# Patient Record
Sex: Female | Born: 1985 | Race: Black or African American | Hispanic: No | Marital: Married | Smoking: Former smoker
Health system: Southern US, Community
[De-identification: ages and names within clinical notes are randomized; demographics above are authoritative.]

## PROBLEM LIST (undated history)

## (undated) DIAGNOSIS — O1490 Unspecified pre-eclampsia, unspecified trimester: Secondary | ICD-10-CM

## (undated) DIAGNOSIS — F32A Depression, unspecified: Secondary | ICD-10-CM

## (undated) DIAGNOSIS — F419 Anxiety disorder, unspecified: Secondary | ICD-10-CM

## (undated) DIAGNOSIS — G43909 Migraine, unspecified, not intractable, without status migrainosus: Secondary | ICD-10-CM

## (undated) DIAGNOSIS — I890 Lymphedema, not elsewhere classified: Secondary | ICD-10-CM

## (undated) DIAGNOSIS — I10 Essential (primary) hypertension: Secondary | ICD-10-CM

## (undated) HISTORY — DX: Depression, unspecified: F32.A

## (undated) HISTORY — PX: LYMPHADENECTOMY: SHX15

## (undated) HISTORY — DX: Migraine, unspecified, not intractable, without status migrainosus: G43.909

## (undated) HISTORY — DX: Anxiety disorder, unspecified: F41.9

---
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
## 2012-05-13 ENCOUNTER — Emergency Department (HOSPITAL_COMMUNITY): Payer: Self-pay

## 2012-05-13 ENCOUNTER — Encounter (HOSPITAL_COMMUNITY): Payer: Self-pay | Admitting: Emergency Medicine

## 2012-05-13 ENCOUNTER — Emergency Department (HOSPITAL_COMMUNITY)
Admission: EM | Admit: 2012-05-13 | Discharge: 2012-05-13 | Disposition: A | Discharge status: Home / Self Care | Payer: Self-pay | Attending: Emergency Medicine | Admitting: Emergency Medicine

## 2012-05-13 DIAGNOSIS — S93609A Unspecified sprain of unspecified foot, initial encounter: Secondary | ICD-10-CM | POA: Insufficient documentation

## 2012-05-13 DIAGNOSIS — F17200 Nicotine dependence, unspecified, uncomplicated: Secondary | ICD-10-CM | POA: Insufficient documentation

## 2012-05-13 DIAGNOSIS — S96911A Strain of unspecified muscle and tendon at ankle and foot level, right foot, initial encounter: Secondary | ICD-10-CM

## 2012-05-13 DIAGNOSIS — Z9889 Other specified postprocedural states: Secondary | ICD-10-CM | POA: Insufficient documentation

## 2012-05-13 DIAGNOSIS — X500XXA Overexertion from strenuous movement or load, initial encounter: Secondary | ICD-10-CM | POA: Insufficient documentation

## 2012-05-13 DIAGNOSIS — M7989 Other specified soft tissue disorders: Secondary | ICD-10-CM | POA: Insufficient documentation

## 2012-05-13 DIAGNOSIS — Y92009 Unspecified place in unspecified non-institutional (private) residence as the place of occurrence of the external cause: Secondary | ICD-10-CM | POA: Insufficient documentation

## 2012-05-13 DIAGNOSIS — R1909 Other intra-abdominal and pelvic swelling, mass and lump: Secondary | ICD-10-CM | POA: Insufficient documentation

## 2012-05-13 DIAGNOSIS — Y9389 Activity, other specified: Secondary | ICD-10-CM | POA: Insufficient documentation

## 2012-05-13 MED ORDER — IBUPROFEN 600 MG PO TABS: 600.0000 mg | ORAL_TABLET | Freq: Four times a day (QID) | ORAL | Status: DC | PRN

## 2012-05-13 NOTE — ED Notes (Addendum)
Went in to d/c pt. Pt was not found x2. Attempted to call pt x2. Number not in service.

## 2012-05-13 NOTE — ED Notes (Signed)
Pt c/o "cyst" to right hip that has larger and more painful. Pt also c/o pain/swelling to right foot x 2 days.

## 2012-05-16 NOTE — ED Provider Notes (Signed)
Medical screening examination/treatment/procedure(s) were performed by non-physician practitioner and as supervising physician I was immediately available for consultation/collaboration.   Jenai Scaletta M Deletha Jaffee, DO 05/16/12 1309 

## 2012-05-16 NOTE — ED Provider Notes (Signed)
History     CSN: [id number]  Arrival date & time 05/13/12  4132   First MD Initiated Contact with Patient 05/13/12 1030      Chief Complaint  Patient presents with  . Foot Pain  . Cyst    (Consider location/radiation/quality/duration/timing/severity/associated sxs/prior treatment) HPI Comments: Emma Scott presents with right foot pain which started suddenly when stepping out of her shower 2 days ago.  She denies injury such as inverting her foot,  Or stepping on an uneven surface.  Her foot has been swollen along the lateral edge since the event and she is concerned about possible fracture.  She has used no medication or treatments   Pain is aching,  And worse when weight bearing and palpation.   She also has concern about a swelling of her right groin which has been present for the past year, and has slowly become more swollen and tender. There has been no drainage from this lesion, nor has there been any redness.  She has not seen her pcp for this complaint nor has she tried any treatments.  The history is provided by the patient.    History reviewed. No pertinent past medical history.  Past Surgical History  Procedure Date  . Cesarean section   . Lymphadenectomy     No family history on file.  History  Substance Use Topics  . Smoking status: Current Every Day Smoker -- 0.2 packs/day  . Smokeless tobacco: Not on file  . Alcohol Use: Yes     Comment: occasional    OB History    Grav Para Term Preterm Abortions TAB SAB Ect Mult Living                  Review of Systems  Musculoskeletal: Positive for joint swelling and arthralgias.  Skin: Negative for rash and wound.  Neurological: Negative for weakness and numbness.    Allergies  Penicillins and Sulfa antibiotics  Home Medications   Current Outpatient Rx  Name  Route  Sig  Dispense  Refill  . IBUPROFEN 600 MG PO TABS   Oral   Take 1 tablet (600 mg total) by mouth every 6 (six) hours as needed for  pain.   30 tablet   0     BP 125/84  Pulse 68  Temp 98.3 F (36.8 C)  Resp 16  Ht 5\' 3"  (1.6 m)  Wt 235 lb (106.595 kg)  BMI 41.63 kg/m2  SpO2 100%  LMP 04/22/2012  Physical Exam  Constitutional: She appears well-developed and well-nourished.  HENT:  Head: Atraumatic.  Neck: Normal range of motion.  Cardiovascular:       Pulses equal bilaterally  Musculoskeletal: She exhibits tenderness.       ttp at right 5th metatarsal head without edema, erythema or deformity.  Neurological: She is alert. She has normal strength. She displays normal reflexes. No sensory deficit.       Equal strength  Skin: Skin is warm and dry.       Non draining , slightly indurated nodule right groin crease.  No drainage, erythema or pointing.    Psychiatric: She has a normal mood and affect.    ED Course  Procedures (including critical care time)  Labs Reviewed - No data to display No results found.   1. Right foot strain   2. Nodule of groin    Informal bedside US of right groin reveals completely compressible superficial circumferential and elongated structure, ? Vascular structure, doubt sebaceous  cyst,  Lymph node.    MDM  Patients labs and/or radiological studies were reviewed during the medical decision making and disposition process. Pt prescribed ibuprofen,  Encouraged rest of foot,  Elevation.  Recommended crutches, aso,  Minimize weight bearing.  F/u with pcp if not improved over the next week.  Warm compresses applied to groin.          Burgess Amor, PA 05/16/12 1300

## 2012-05-22 ENCOUNTER — Encounter (HOSPITAL_COMMUNITY): Payer: Self-pay

## 2012-05-22 ENCOUNTER — Emergency Department (HOSPITAL_COMMUNITY)
Admission: EM | Admit: 2012-05-22 | Discharge: 2012-05-22 | Disposition: A | Discharge status: Home / Self Care | Payer: Self-pay | Attending: Emergency Medicine | Admitting: Emergency Medicine

## 2012-05-22 DIAGNOSIS — L02214 Cutaneous abscess of groin: Secondary | ICD-10-CM

## 2012-05-22 DIAGNOSIS — Z4801 Encounter for change or removal of surgical wound dressing: Secondary | ICD-10-CM | POA: Insufficient documentation

## 2012-05-22 DIAGNOSIS — F17200 Nicotine dependence, unspecified, uncomplicated: Secondary | ICD-10-CM | POA: Insufficient documentation

## 2012-05-22 MED ORDER — OXYCODONE-ACETAMINOPHEN 5-325 MG PO TABS
1.0000 | ORAL_TABLET | Freq: Once | ORAL | Status: AC
  Administered 2012-05-22: 1 via ORAL
  Filled 2012-05-22: qty 1

## 2012-05-22 MED ORDER — DOXYCYCLINE HYCLATE 100 MG PO TABS
100.0000 mg | ORAL_TABLET | Freq: Once | ORAL | Status: AC
  Administered 2012-05-22: 100 mg via ORAL
  Filled 2012-05-22: qty 1

## 2012-05-22 MED ORDER — DOXYCYCLINE HYCLATE 100 MG PO CAPS: 100.0000 mg | ORAL_CAPSULE | Freq: Two times a day (BID) | ORAL | Status: DC

## 2012-05-22 MED ORDER — HYDROCODONE-ACETAMINOPHEN 5-325 MG PO TABS: 1.0000 | ORAL_TABLET | ORAL | Status: DC | PRN

## 2012-05-22 NOTE — ED Notes (Signed)
Pt stated she has been having nausea and vomiting.   Drinking coffee at arrival. Denies any fevers.

## 2012-05-22 NOTE — ED Notes (Signed)
Patient with no complaints at this time. Respirations even and unlabored. Skin warm/dry. Discharge instructions reviewed with patient at this time. Patient given opportunity to voice concerns/ask questions. Patient discharged at this time and left Emergency Department with steady gait.   

## 2012-05-22 NOTE — ED Notes (Signed)
Patient ambulatory to room with steady gait for triage.

## 2012-05-22 NOTE — ED Provider Notes (Signed)
History     CSN: [id number]  Arrival date & time 05/22/12  4098   None     Chief Complaint  Patient presents with  . Wound Check    HPI Emma Scott is a 26 y.o. female who present to the ED for tender swollen area. The area is located in the right inguinal area. The pain started about a week ago. She rates the pain as 8/10. She noted drainage from the area yesterday but today the area is hard and painful. She denies fever, nausea, vomiting or any other problems. The history was provided by the patient.  History reviewed. No pertinent past medical history.  Past Surgical History  Procedure Date  . Cesarean section   . Lymphadenectomy     No family history on file.  History  Substance Use Topics  . Smoking status: Current Every Day Smoker -- 0.2 packs/day    Types: Cigarettes  . Smokeless tobacco: Not on file  . Alcohol Use: Yes     Comment: occasional    OB History    Grav Para Term Preterm Abortions TAB SAB Ect Mult Living                  Review of Systems  Constitutional: Negative for fever and activity change.  HENT: Negative.   Eyes: Negative.   Respiratory: Negative.   Gastrointestinal: Negative for nausea, abdominal pain and diarrhea.       Pain right inguinal area  Genitourinary: Negative for dysuria, urgency and frequency.  Musculoskeletal: Negative for back pain.  Psychiatric/Behavioral: Negative for confusion. The patient is not nervous/anxious.     Allergies  Penicillins and Sulfa antibiotics  Home Medications  No current outpatient prescriptions on file.  BP 129/77  Pulse 93  Temp 98.1 F (36.7 C) (Oral)  Resp 20  SpO2 99%  LMP 05/16/2012  Physical Exam  Nursing note and vitals reviewed. Constitutional: No distress.       Obese A/A female  HENT:  Head: Normocephalic.  Neck: Normal range of motion. Neck supple.  Cardiovascular: Normal rate.   Pulmonary/Chest: Effort normal.  Abdominal: Soft.       Red, firm, raised, tender  area palpated right inguinal area. Small amount of drainage noted.   Psychiatric: She has a normal mood and affect. Her behavior is normal. Judgment and thought content normal.   Assessment: 26 y.o. female with abscess to right groin  Plan;  Rx doxycycline   Rx Norco   Warm wet compresses to the area   . Recheck 2 days   Discussed with the patient and all questioned fully answered. She will return in 2 days or sooner if any problems arise.    Medication List     As of 05/22/2012 11:05 AM    START taking these medications         doxycycline 100 MG capsule   Commonly known as: VIBRAMYCIN   Take 1 capsule (100 mg total) by mouth 2 (two) times daily.      HYDROcodone-acetaminophen 5-325 MG per tablet   Commonly known as: NORCO/VICODIN   Take 1 tablet by mouth every 4 (four) hours as needed for pain.          Where to get your medications    These are the prescriptions that you need to pick up.   You may get these medications from any pharmacy.         doxycycline 100 MG capsule   HYDROcodone-acetaminophen 5-325  MG per tablet           ED Course  Procedures      [facility], NP 05/22/12 1105

## 2012-05-22 NOTE — ED Notes (Signed)
Pt wants area to right upper leg rechecked was seen for same on the 12/11.  Left without discharge papers at that visit.

## 2012-05-22 NOTE — ED Provider Notes (Signed)
Medical screening examination/treatment/procedure(s) were performed by non-physician practitioner and as supervising physician I was immediately available for consultation/collaboration.   Joya Gaskins, MD 05/22/12 [phone]

## 2013-01-15 ENCOUNTER — Emergency Department (HOSPITAL_COMMUNITY)
Admission: EM | Admit: 2013-01-15 | Discharge: 2013-01-15 | Disposition: A | Discharge status: Home / Self Care | Payer: Self-pay | Attending: Emergency Medicine | Admitting: Emergency Medicine

## 2013-01-15 ENCOUNTER — Encounter (HOSPITAL_COMMUNITY): Payer: Self-pay

## 2013-01-15 DIAGNOSIS — F17200 Nicotine dependence, unspecified, uncomplicated: Secondary | ICD-10-CM | POA: Insufficient documentation

## 2013-01-15 DIAGNOSIS — Z79899 Other long term (current) drug therapy: Secondary | ICD-10-CM | POA: Insufficient documentation

## 2013-01-15 DIAGNOSIS — R220 Localized swelling, mass and lump, head: Secondary | ICD-10-CM | POA: Insufficient documentation

## 2013-01-15 DIAGNOSIS — R221 Localized swelling, mass and lump, neck: Secondary | ICD-10-CM | POA: Insufficient documentation

## 2013-01-15 DIAGNOSIS — K089 Disorder of teeth and supporting structures, unspecified: Secondary | ICD-10-CM | POA: Insufficient documentation

## 2013-01-15 DIAGNOSIS — R51 Headache: Secondary | ICD-10-CM | POA: Insufficient documentation

## 2013-01-15 DIAGNOSIS — K0889 Other specified disorders of teeth and supporting structures: Secondary | ICD-10-CM

## 2013-01-15 DIAGNOSIS — K029 Dental caries, unspecified: Secondary | ICD-10-CM | POA: Insufficient documentation

## 2013-01-15 MED ORDER — CLINDAMYCIN HCL 150 MG PO CAPS: 300.0000 mg | ORAL_CAPSULE | Freq: Four times a day (QID) | ORAL | Status: DC

## 2013-01-15 MED ORDER — NAPROXEN 500 MG PO TABS: 500.0000 mg | ORAL_TABLET | Freq: Two times a day (BID) | ORAL | Status: DC

## 2013-01-15 NOTE — ED Provider Notes (Signed)
CSN: [id number]     Arrival date & time 01/15/13  1000 History     First MD Initiated Contact with Patient 01/15/13 1059     Chief Complaint  Patient presents with  . Dental Pain   (Consider location/radiation/quality/duration/timing/severity/associated sxs/prior Treatment) Patient is a 27 y.o. female presenting with tooth pain. The history is provided by the patient.  Dental Pain Location:  Lower Lower teeth location:  17/LL 3rd molar Quality:  Dull, aching and constant Severity:  Moderate Onset quality:  Gradual Duration:  4 days Timing:  Constant Progression:  Unchanged Chronicity:  New Context: not abscess, cap still on, not dental caries, normal dentition and not trauma   Relieved by:  Nothing Worsened by:  Cold food/drink and hot food/drink Ineffective treatments:  None tried Associated symptoms: facial pain and gum swelling   Associated symptoms: no congestion, no difficulty swallowing, no drooling, no facial swelling, no fever, no headaches, no neck pain, no neck swelling, no oral bleeding and no oral lesions   Risk factors: lack of dental care and smoking   Risk factors: no diabetes     History reviewed. No pertinent past medical history. Past Surgical History  Procedure Laterality Date  . Cesarean section    . Lymphadenectomy     No family history on file. History  Substance Use Topics  . Smoking status: Current Every Day Smoker -- 0.25 packs/day    Types: Cigarettes  . Smokeless tobacco: Not on file  . Alcohol Use: Yes     Comment: occasional   OB History   Grav Para Term Preterm Abortions TAB SAB Ect Mult Living                 Review of Systems  Constitutional: Negative for fever and appetite change.  HENT: Positive for dental problem. Negative for congestion, sore throat, facial swelling, drooling, mouth sores, trouble swallowing, neck pain and neck stiffness.   Eyes: Negative for pain and visual disturbance.  Respiratory: Negative for chest  tightness.   Gastrointestinal: Negative for nausea and vomiting.  Neurological: Negative for dizziness, facial asymmetry and headaches.  Hematological: Negative for adenopathy.  All other systems reviewed and are negative.    Allergies  Penicillins and Sulfa antibiotics  Home Medications   Current Outpatient Rx  Name  Route  Sig  Dispense  Refill  . doxycycline (VIBRAMYCIN) 100 MG capsule   Oral   Take 1 capsule (100 mg total) by mouth 2 (two) times daily.   20 capsule   0   . HYDROcodone-acetaminophen (NORCO/VICODIN) 5-325 MG per tablet   Oral   Take 1 tablet by mouth every 4 (four) hours as needed for pain.   10 tablet   0    BP 121/77  Pulse 60  Temp(Src) 98.5 F (36.9 C) (Oral)  Resp 16  SpO2 100%  LMP 12/31/2012 Physical Exam  Nursing note and vitals reviewed. Constitutional: She is oriented to person, place, and time. She appears well-developed and well-nourished. No distress.  HENT:  Head: Normocephalic and atraumatic.  Right Ear: Tympanic membrane and ear canal normal.  Left Ear: Tympanic membrane and ear canal normal.  Mouth/Throat: Uvula is midline, oropharynx is clear and moist and mucous membranes are normal. No trismus in the jaw. Dental caries present. No dental abscesses or edematous.    Slightly impacted left lower third molar.  No abscess, trismus, or facial edema  Neck: Normal range of motion. Neck supple.  Cardiovascular: Normal rate, regular rhythm and  normal heart sounds.   No murmur heard. Pulmonary/Chest: Effort normal and breath sounds normal. No respiratory distress.  Musculoskeletal: Normal range of motion.  Lymphadenopathy:    She has no cervical adenopathy.  Neurological: She is alert and oriented to person, place, and time. She exhibits normal muscle tone. Coordination normal.  Skin: Skin is warm and dry.    ED Course   Procedures (including critical care time)  Labs Reviewed - No data to display No results found. No  diagnosis found.  MDM     Patient has ttp of the left lower third molar.  Appears slightly impacted.  No facial edema, trismus or abscess.  VSS.  She appears stable for discharge.   Jushua Waltman L. Trisha Mangle, PA-C 01/15/13 1110

## 2013-01-15 NOTE — ED Notes (Signed)
Pt reports pain to her wisdom teeth on the left side. Pt reports pain for about a week.  Pt also reports sore throat.  Pt reports some difficulty swallowing.

## 2013-01-16 NOTE — ED Provider Notes (Signed)
Medical screening examination/treatment/procedure(s) were performed by non-physician practitioner and as supervising physician I was immediately available for consultation/collaboration.  Shelda Jakes, MD 01/16/13 [phone]

## 2013-04-27 ENCOUNTER — Emergency Department (HOSPITAL_COMMUNITY)
Admission: EM | Admit: 2013-04-27 | Discharge: 2013-04-27 | Disposition: A | Discharge status: Home / Self Care | Payer: Medicaid Other | Attending: Emergency Medicine | Admitting: Emergency Medicine

## 2013-04-27 ENCOUNTER — Encounter (HOSPITAL_COMMUNITY): Payer: Self-pay | Admitting: Emergency Medicine

## 2013-04-27 DIAGNOSIS — Z792 Long term (current) use of antibiotics: Secondary | ICD-10-CM | POA: Insufficient documentation

## 2013-04-27 DIAGNOSIS — Z87891 Personal history of nicotine dependence: Secondary | ICD-10-CM | POA: Insufficient documentation

## 2013-04-27 DIAGNOSIS — K0889 Other specified disorders of teeth and supporting structures: Secondary | ICD-10-CM

## 2013-04-27 DIAGNOSIS — Z880 Allergy status to penicillin: Secondary | ICD-10-CM | POA: Insufficient documentation

## 2013-04-27 DIAGNOSIS — K089 Disorder of teeth and supporting structures, unspecified: Secondary | ICD-10-CM | POA: Insufficient documentation

## 2013-04-27 MED ORDER — ONDANSETRON HCL 4 MG PO TABS: 4.0000 mg | ORAL_TABLET | Freq: Three times a day (TID) | ORAL | Status: DC | PRN

## 2013-04-27 MED ORDER — NAPROXEN 250 MG PO TABS: 250.0000 mg | ORAL_TABLET | Freq: Two times a day (BID) | ORAL | Status: DC | PRN

## 2013-04-27 MED ORDER — CLINDAMYCIN HCL 150 MG PO CAPS: ORAL_CAPSULE | ORAL | Status: DC

## 2013-04-27 NOTE — ED Notes (Signed)
Pt c/o right lower dental pain due to wisdom teeth coming in. N/v x 2 daily x 4 days now due to pain. Nad. Mm wet. Able to hold fluids down. No obvious swelling noted. Taking ibuprofen with temporary relief.

## 2013-04-27 NOTE — ED Provider Notes (Signed)
CSN: [id number]     Arrival date & time 04/27/13  0705 History   First MD Initiated Contact with Patient 04/27/13 0725     Chief Complaint  Patient presents with  . Dental Pain    HPI Pt was seen at 0730. Per pt, c/o gradual onset and persistence of constant left lower tooth "pain" for the past several months, worse over the past 4 days.  States the pain makes her nauseated and occasionally vomit. Has been able to tol PO fluids well. Denies fevers, no intra-oral edema, no rash, no facial swelling, no dysphagia, no neck pain.   The condition is aggravated by nothing. The condition is relieved by nothing. The patient has no significant history of serious medical conditions.     History reviewed. No pertinent past medical history.  Past Surgical History  Procedure Laterality Date  . Cesarean section    . Lymphadenectomy      History  Substance Use Topics  . Smoking status: Former Smoker -- 0.25 packs/day    Types: Cigarettes  . Smokeless tobacco: Not on file  . Alcohol Use: Yes     Comment: occasional    Review of Systems ROS: Statement: All systems negative except as marked or noted in the HPI; Constitutional: Negative for fever and chills. ; ; Eyes: Negative for eye pain and discharge. ; ; ENMT: Positive for toothache. Negative for ear pain, bleeding gums, dental injury, facial deformity, facial swelling, hoarseness, nasal congestion, sinus pressure, sore throat, throat swelling and tongue swollen. ; ; Cardiovascular: Negative for chest pain, palpitations, diaphoresis, dyspnea and peripheral edema. ; ; Respiratory: Negative for cough, wheezing and stridor. ; ; Gastrointestinal: +nausea. Negative for diarrhea and abdominal pain. ; ; Genitourinary: Negative for dysuria, flank pain and hematuria. ; ; Musculoskeletal: Negative for back pain and neck pain. ; ; Skin: Negative for rash and skin lesion. ; ; Neuro: Negative for headache, lightheadedness and neck stiffness. ;    Allergies   Penicillins and Sulfa antibiotics  Home Medications   Current Outpatient Rx  Name  Route  Sig  Dispense  Refill  . clindamycin (CLEOCIN) 150 MG capsule   Oral   Take 2 capsules (300 mg total) by mouth 4 (four) times daily.   56 capsule   0   . clindamycin (CLEOCIN) 150 MG capsule      3 tabs PO TID x 10 days   90 capsule   0   . doxycycline (VIBRAMYCIN) 100 MG capsule   Oral   Take 1 capsule (100 mg total) by mouth 2 (two) times daily.   20 capsule   0   . HYDROcodone-acetaminophen (NORCO/VICODIN) 5-325 MG per tablet   Oral   Take 1 tablet by mouth every 4 (four) hours as needed for pain.   10 tablet   0   . naproxen (NAPROSYN) 250 MG tablet   Oral   Take 1 tablet (250 mg total) by mouth 2 (two) times daily as needed (for pain. Take with food.).   14 tablet   0   . naproxen (NAPROSYN) 500 MG tablet   Oral   Take 1 tablet (500 mg total) by mouth 2 (two) times daily.   20 tablet   0   . ondansetron (ZOFRAN) 4 MG tablet   Oral   Take 1 tablet (4 mg total) by mouth every 8 (eight) hours as needed for nausea or vomiting.   6 tablet   0    BP 126/80  Pulse 82  Temp(Src) 97.7 F (36.5 C) (Oral)  Resp 18  SpO2 100%  LMP 04/13/2013 Physical Exam 0730: Physical examination: Vital signs and O2 SAT: Reviewed; Constitutional: Well developed, Well nourished, Well hydrated, In no acute distress; Head and Face: Normocephalic, Atraumatic; Eyes: EOMI, PERRL, No scleral icterus; ENMT: Mouth and pharynx normal, Scattered dental decay, Left TM normal, Right TM normal, Mucous membranes moist, +lower left wisdom tooth erupting.  No gingival erythema, edema, fluctuance, or drainage.  No intra-oral edema. No submandibular or sublingual edema. No hoarse voice, no drooling, no stridor.  ; Neck: Supple, Full range of motion, No lymphadenopathy; Cardiovascular: Regular rate and rhythm, No gallop; Respiratory: Breath sounds clear & equal bilaterally, No rales, rhonchi, wheezes, Normal  respiratory effort/excursion; Chest: Nontender, Movement normal; Extremities: Pulses normal, No tenderness, No edema; Neuro: AA&Ox3, Major CN grossly intact.  No gross focal motor or sensory deficits in extremities.; Skin: Color normal, No rash, No petechiae, Warm, Dry   ED Course  Procedures   EKG Interpretation   None       MDM  MDM Reviewed: previous chart, nursing note and vitals      0740:  Appears NAD, texting on cellphone while in ED. Seen in ED 3 months ago for same symptoms; did not f/u with dentist or oral surgeon. Pt encouraged to f/u with dentist or oral surgeon for her dental needs for good continuity of care and definitive treatment.  Verb understanding.    Laray Anger, DO 04/28/13 2148

## 2013-04-27 NOTE — ED Notes (Signed)
Patient with no complaints at this time. Respirations even and unlabored. Skin warm/dry. Discharge instructions reviewed with patient at this time. Patient given opportunity to voice concerns/ask questions. Patient discharged at this time and left Emergency Department with steady gait.   

## 2013-06-16 ENCOUNTER — Encounter (HOSPITAL_COMMUNITY): Payer: Self-pay | Admitting: Emergency Medicine

## 2013-06-16 ENCOUNTER — Emergency Department (HOSPITAL_COMMUNITY): Payer: Medicaid Other

## 2013-06-16 ENCOUNTER — Emergency Department (HOSPITAL_COMMUNITY)
Admission: EM | Admit: 2013-06-16 | Discharge: 2013-06-16 | Disposition: A | Discharge status: Home / Self Care | Payer: Medicaid Other | Attending: Emergency Medicine | Admitting: Emergency Medicine

## 2013-06-16 DIAGNOSIS — Z880 Allergy status to penicillin: Secondary | ICD-10-CM | POA: Insufficient documentation

## 2013-06-16 DIAGNOSIS — Z87891 Personal history of nicotine dependence: Secondary | ICD-10-CM | POA: Insufficient documentation

## 2013-06-16 DIAGNOSIS — R69 Illness, unspecified: Secondary | ICD-10-CM

## 2013-06-16 DIAGNOSIS — Z3202 Encounter for pregnancy test, result negative: Secondary | ICD-10-CM | POA: Insufficient documentation

## 2013-06-16 DIAGNOSIS — R300 Dysuria: Secondary | ICD-10-CM | POA: Insufficient documentation

## 2013-06-16 DIAGNOSIS — J111 Influenza due to unidentified influenza virus with other respiratory manifestations: Secondary | ICD-10-CM | POA: Insufficient documentation

## 2013-06-16 DIAGNOSIS — G8929 Other chronic pain: Secondary | ICD-10-CM | POA: Insufficient documentation

## 2013-06-16 DIAGNOSIS — I10 Essential (primary) hypertension: Secondary | ICD-10-CM | POA: Insufficient documentation

## 2013-06-16 DIAGNOSIS — M7989 Other specified soft tissue disorders: Secondary | ICD-10-CM | POA: Insufficient documentation

## 2013-06-16 DIAGNOSIS — R0602 Shortness of breath: Secondary | ICD-10-CM | POA: Insufficient documentation

## 2013-06-16 DIAGNOSIS — R0789 Other chest pain: Secondary | ICD-10-CM | POA: Insufficient documentation

## 2013-06-16 HISTORY — DX: Essential (primary) hypertension: I10

## 2013-06-16 LAB — URINALYSIS, ROUTINE W REFLEX MICROSCOPIC
BILIRUBIN URINE: NEGATIVE
Glucose, UA: NEGATIVE mg/dL
HGB URINE DIPSTICK: NEGATIVE
KETONES UR: NEGATIVE mg/dL
Leukocytes, UA: NEGATIVE
NITRITE: NEGATIVE
PH: 6 (ref 5.0–8.0)
Specific Gravity, Urine: >1.03 — ABNORMAL HIGH (ref 1.005–1.030)
Urobilinogen, UA: 1 mg/dL (ref 0.0–1.0)

## 2013-06-16 LAB — URINE MICROSCOPIC-ADD ON

## 2013-06-16 LAB — PREGNANCY, URINE: PREG TEST UR: NEGATIVE

## 2013-06-16 MED ORDER — ONDANSETRON 4 MG PO TBDP
4.0000 mg | ORAL_TABLET | Freq: Once | ORAL | Status: AC
  Administered 2013-06-16: 4 mg via ORAL
  Filled 2013-06-16: qty 1

## 2013-06-16 MED ORDER — ACETAMINOPHEN 325 MG PO TABS
650.0000 mg | ORAL_TABLET | Freq: Once | ORAL | Status: AC
  Administered 2013-06-16: 650 mg via ORAL
  Filled 2013-06-16: qty 2

## 2013-06-16 NOTE — ED Notes (Signed)
Pt c/o fever, body aches, cough, chills, and vomiting since yesterday.

## 2013-06-16 NOTE — ED Provider Notes (Signed)
CSN: [id number]     Arrival date & time 06/16/13  [id number] History  This chart was scribed for Glynn OctaveStephen Ronisha Herringshaw, MD by Leone PayorSonum Patel, ED Scribe. This patient was seen in room APA19/APA19 and the patient's care was started 8:04 AM.    Chief Complaint  Patient presents with  . Generalized Body Aches  . Cough    The history is provided by the patient. No language interpreter was used.    HPI Comments: Emma Scott is a 28 y.o. female who presents to the Emergency Department complaining of 12 hours of gradual onset, gradually worsening, constant generalized myalgias with associated cough, rhinorrhea, sore throat, and HA. She also reports having a fever yesterday, TMAX 101.7. She has taken ibuprofen 4 hours ago with relief. She reports having 5 episodes of post tussive emesis, dysuria, along with chest tightness and SOB which is associated with the coughing. She denies having the flu vaccine this year. She denies having sick contacts with similar symptoms. She denies diarrhea, abdominal pain. She denies any recent long distance travel or taking birth control. LNMP 1 week ago.   Past Medical History  Diagnosis Date  . Hypertension    Past Surgical History  Procedure Laterality Date  . Cesarean section    . Lymphadenectomy     History reviewed. No pertinent family history. History  Substance Use Topics  . Smoking status: Former Smoker -- 0.25 packs/day    Types: Cigarettes  . Smokeless tobacco: Not on file  . Alcohol Use: Yes     Comment: occasional   OB History   Grav Para Term Preterm Abortions TAB SAB Ect Mult Living                 Review of Systems A complete 10 system review of systems was obtained and all systems are negative except as noted in the HPI and PMH.   Allergies  Penicillins and Sulfa antibiotics  Home Medications   Current Outpatient Rx  Name  Route  Sig  Dispense  Refill  . ibuprofen (ADVIL,MOTRIN) 200 MG tablet   Oral   Take 800-1,600 mg by mouth every 6 (six)  hours as needed for fever or moderate pain.          BP 126/75  Pulse 96  Temp(Src) 97.8 F (36.6 C)  Resp 22  SpO2 98%  LMP 06/09/2013 Physical Exam  Nursing note and vitals reviewed. Constitutional: She is oriented to person, place, and time. She appears well-developed and well-nourished.  Non toxic appearing  HENT:  Head: Normocephalic and atraumatic.  Right Ear: Tympanic membrane, external ear and ear canal normal.  Left Ear: Tympanic membrane, external ear and ear canal normal.  Mouth/Throat: Uvula is midline, oropharynx is clear and moist and mucous membranes are normal. No oropharyngeal exudate, posterior oropharyngeal edema, posterior oropharyngeal erythema or tonsillar abscesses.  Eyes: Pupils are equal, round, and reactive to light.  Neck: Normal range of motion. Neck supple.  No meningismus.   Cardiovascular: Normal rate, regular rhythm and normal heart sounds.   Pulmonary/Chest: Effort normal and breath sounds normal. No respiratory distress. She has no wheezes. She has no rales. She exhibits no tenderness.  Abdominal: Soft. Bowel sounds are normal. She exhibits no distension. There is no tenderness. There is no rebound and no guarding.  Musculoskeletal:  Left leg with chronic swelling, per pt.   Neurological: She is alert and oriented to person, place, and time.  Skin: Skin is warm and dry.  Psychiatric: She  has a normal mood and affect.    ED Course  Procedures (including critical care time)  DIAGNOSTIC STUDIES: Oxygen Saturation is 98% on RA, normal by my interpretation.    COORDINATION OF CARE: 8:09 AM Will order CXR, UA. Discussed treatment plan with pt at bedside and pt agreed to plan.  Medications  acetaminophen (TYLENOL) tablet 650 mg (650 mg Oral Given 06/16/13 0815)  ondansetron (ZOFRAN-ODT) disintegrating tablet 4 mg (4 mg Oral Given 06/16/13 0815)    Labs Review Labs Reviewed  URINALYSIS, ROUTINE W REFLEX MICROSCOPIC - Abnormal; Notable for the  following:    Specific Gravity, Urine >1.030 (*)    Protein, ur TRACE (*)    All other components within normal limits  PREGNANCY, URINE  URINE MICROSCOPIC-ADD ON   Imaging Review Dg Chest 2 View  06/16/2013   CLINICAL DATA:  Cough and congestion.  Fever with vomiting.  EXAM: CHEST  2 VIEW  COMPARISON:  None.  FINDINGS: The heart size and mediastinal contours are within normal limits. Both lungs are clear. The visualized skeletal structures are unremarkable.  IMPRESSION: No active cardiopulmonary disease.   Electronically Signed   By: Davonna Belling M.D.   On: 06/16/2013 08:48    EKG Interpretation   None       MDM   1. Influenza-like illness    URI symptoms with cough, congestion, bodyaches, headache, vomiting, since last night. Fever to 101.  No distress. Vital stable. No meningismus. Lungs clear. Viral illness, likely influenza.  Chest x-ray negative. Urinalysis negative. Patient tolerating by mouth in ED. Supportive care for influenza.  Risks and benefits of Tamiflu discussed with patient. She declines Tamiflu at this time. Followup with PCP this week.  I personally performed the services described in this documentation, which was scribed in my presence. The recorded information has been reviewed and is accurate.   Glynn Octave, MD 06/16/13 [phone]

## 2013-06-16 NOTE — ED Notes (Signed)
Pt c/o body aches,n/v/cough/headache. Dry cough noted. Sx's started yesterday. Mm wet.

## 2013-06-16 NOTE — Discharge Instructions (Signed)

## 2013-09-29 ENCOUNTER — Encounter (HOSPITAL_COMMUNITY): Payer: Self-pay | Admitting: Emergency Medicine

## 2013-09-29 ENCOUNTER — Emergency Department (HOSPITAL_COMMUNITY)
Admission: EM | Admit: 2013-09-29 | Discharge: 2013-09-29 | Discharge status: Against Medical Advice | Payer: Medicaid Other | Attending: Emergency Medicine | Admitting: Emergency Medicine

## 2013-09-29 DIAGNOSIS — K089 Disorder of teeth and supporting structures, unspecified: Secondary | ICD-10-CM | POA: Insufficient documentation

## 2013-09-29 DIAGNOSIS — I10 Essential (primary) hypertension: Secondary | ICD-10-CM | POA: Insufficient documentation

## 2013-09-29 NOTE — ED Notes (Signed)
Pt called twice for room placement. No response

## 2013-09-29 NOTE — ED Notes (Signed)
Called patient from waiting room 2 times to take her to room 24 and no answer at 2131 and 2140

## 2013-09-29 NOTE — ED Notes (Signed)
Pt reporting having some wisdom teeth that need to be removed.  Reports increased pain for past couple days.  Reporting pain in ear on same side.

## 2013-09-29 NOTE — ED Notes (Signed)
Pt called 3 times for room placement.  No response.  

## 2014-09-06 ENCOUNTER — Encounter (HOSPITAL_COMMUNITY): Payer: Self-pay | Admitting: Emergency Medicine

## 2014-09-06 ENCOUNTER — Emergency Department (HOSPITAL_COMMUNITY)
Admission: EM | Admit: 2014-09-06 | Discharge: 2014-09-06 | Disposition: A | Discharge status: Home / Self Care | Payer: Medicaid Other | Attending: Emergency Medicine | Admitting: Emergency Medicine

## 2014-09-06 DIAGNOSIS — L02411 Cutaneous abscess of right axilla: Secondary | ICD-10-CM | POA: Diagnosis not present

## 2014-09-06 DIAGNOSIS — I10 Essential (primary) hypertension: Secondary | ICD-10-CM | POA: Diagnosis not present

## 2014-09-06 DIAGNOSIS — Z87891 Personal history of nicotine dependence: Secondary | ICD-10-CM | POA: Insufficient documentation

## 2014-09-06 DIAGNOSIS — Z880 Allergy status to penicillin: Secondary | ICD-10-CM | POA: Insufficient documentation

## 2014-09-06 HISTORY — DX: Lymphedema, not elsewhere classified: I89.0

## 2014-09-06 MED ORDER — OXYCODONE-ACETAMINOPHEN 5-325 MG PO TABS
2.0000 | ORAL_TABLET | Freq: Once | ORAL | Status: AC
  Administered 2014-09-06: 2 via ORAL
  Filled 2014-09-06: qty 2

## 2014-09-06 MED ORDER — POVIDONE-IODINE 10 % EX SOLN
CUTANEOUS | Status: AC
  Filled 2014-09-06: qty 118

## 2014-09-06 MED ORDER — LIDOCAINE HCL (PF) 1 % IJ SOLN
10.0000 mL | Freq: Once | INTRAMUSCULAR | Status: AC
  Administered 2014-09-06: 10 mL
  Filled 2014-09-06: qty 10

## 2014-09-06 MED ORDER — HYDROCODONE-ACETAMINOPHEN 5-325 MG PO TABS
1.0000 | ORAL_TABLET | ORAL | Status: DC | PRN
Start: 2014-09-06 — End: 2015-06-27

## 2014-09-06 MED ORDER — CLINDAMYCIN HCL 150 MG PO CAPS: 150.0000 mg | ORAL_CAPSULE | Freq: Four times a day (QID) | ORAL | Status: DC

## 2014-09-06 NOTE — ED Provider Notes (Signed)
CSN: [id number]     Arrival date & time 09/06/14  1025 History   First MD Initiated Contact with Patient 09/06/14 1112     Chief Complaint  Patient presents with  . Abscess     (Consider location/radiation/quality/duration/timing/severity/associated sxs/prior Treatment) Patient is a 29 y.o. female presenting with abscess. The history is provided by the patient.  Abscess Location:  Shoulder/arm Shoulder/arm abscess location:  R axilla Size:  8 cm Abscess quality: induration, painful and redness   Red streaking: no   Duration:  1 week Progression:  Worsening Pain details:    Quality:  Throbbing, sharp and shooting   Severity:  Severe   Duration:  4 days   Timing:  Constant   Progression:  Worsening Chronicity:  Recurrent (she reports history prior abscesses, same location approximately 7 years ago) Context: not diabetes, not immunosuppression and not injected drug use   Relieved by:  Nothing Worsened by:  Nothing tried Ineffective treatments:  Warm compresses Associated symptoms: no anorexia, no fatigue, no fever and no nausea   Risk factors: prior abscess     Past Medical History  Diagnosis Date  . Hypertension   . Lymphedema    Past Surgical History  Procedure Laterality Date  . Cesarean section    . Lymphadenectomy     History reviewed. No pertinent family history. History  Substance Use Topics  . Smoking status: Former Smoker -- 0.25 packs/day    Types: Cigarettes  . Smokeless tobacco: Not on file  . Alcohol Use: Yes     Comment: occasional   OB History    No data available     Review of Systems  Constitutional: Negative for fever and fatigue.  Gastrointestinal: Negative for nausea and anorexia.  Skin:       Negative except as mentioned in HPI.        Allergies  Penicillins and Sulfa antibiotics  Home Medications   Prior to Admission medications   Medication Sig Start Date End Date Taking? Authorizing Provider  clindamycin (CLEOCIN) 150 MG  capsule Take 1 capsule (150 mg total) by mouth every 6 (six) hours. 09/06/14   Burgess AmorJulie Adlyn Fife, PA-C  HYDROcodone-acetaminophen (NORCO/VICODIN) 5-325 MG per tablet Take 1 tablet by mouth every 4 (four) hours as needed. 09/06/14   Burgess AmorJulie Gates Jividen, PA-C   BP 128/72 mmHg  Pulse 110  Temp(Src) 98.8 F (37.1 C) (Oral)  Resp 18  Ht 5\' 4"  (1.626 m)  Wt 224 lb (101.606 kg)  BMI 38.43 kg/m2  SpO2 99%  LMP 08/26/2014 Physical Exam  Constitutional: She is oriented to person, place, and time. She appears well-developed and well-nourished.  HENT:  Head: Normocephalic.  Neck: Normal range of motion.  Cardiovascular: Normal rate.   Pulmonary/Chest: Effort normal.  Musculoskeletal: Normal range of motion.  Lymphadenopathy:    She has no cervical adenopathy.  Neurological: She is alert and oriented to person, place, and time. No sensory deficit.  Skin:  Large indurated raised abscess right axilla, nondraining, central fluctuance with peripheral nodular induration.  Surrounding erythema without red streaking.    ED Course  Procedures (including critical care time)  INCISION AND DRAINAGE Performed by: Burgess AmorIDOL, Solash Tullo Consent: Verbal consent obtained. Risks and benefits: risks, benefits and alternatives were discussed Type: abscess  Body area: right axilla  Anesthesia: local infiltration  Incision was made with a scalpel.  Local anesthetic: lidocaine 1% without epinephrine  Anesthetic total: 9 ml, 4 used to inject around the periphery of the abscess, 5 to flush  the abscess cavity once opened.  Complexity: complex Blunt dissection to break up loculations.  Nodules scattered around periphery of the site were not amenable to blunt dissection, firm, not fluctuant (pt states has chronic nodularity).  This is within the subq layer, so doubt lymph nodes).  Drainage: purulent  Drainage amount: moderate  Packing material: 1/4 in iodoform gauze  Patient tolerance: Patient tolerated the procedure well with no  immediate complications.    Labs Review Labs Reviewed - No data to display  Imaging Review No results found.   EKG Interpretation None      MDM   Final diagnoses:  Abscess of axilla, right    Pt discussed with Dr. Lovell Sheehan who will recheck pt in his office in 2 days.  Pt understands to call for appt time.  She was placed on clindamycin, hydrocodone. Dressing applied to abscess site.     Burgess Amor, PA-C 09/06/14 2209  Eber Hong, MD 09/07/14 [phone]

## 2014-09-06 NOTE — ED Notes (Signed)
Patient complaining of abscess to right axilla. States she has history of same.

## 2014-09-06 NOTE — ED Notes (Signed)
Pt verbalized understanding of no driving and to use caution within 4 hours of taking pain meds due to meds cause drowsiness 

## 2014-09-06 NOTE — Discharge Instructions (Signed)

## 2015-04-24 ENCOUNTER — Emergency Department (HOSPITAL_COMMUNITY)
Admission: EM | Admit: 2015-04-24 | Discharge: 2015-04-24 | Disposition: A | Discharge status: Home / Self Care | Payer: Medicaid Other | Attending: Emergency Medicine | Admitting: Emergency Medicine

## 2015-04-24 ENCOUNTER — Encounter (HOSPITAL_COMMUNITY): Payer: Self-pay | Admitting: Emergency Medicine

## 2015-04-24 DIAGNOSIS — J209 Acute bronchitis, unspecified: Secondary | ICD-10-CM | POA: Diagnosis not present

## 2015-04-24 DIAGNOSIS — I10 Essential (primary) hypertension: Secondary | ICD-10-CM | POA: Insufficient documentation

## 2015-04-24 DIAGNOSIS — R05 Cough: Secondary | ICD-10-CM | POA: Diagnosis present

## 2015-04-24 DIAGNOSIS — Z880 Allergy status to penicillin: Secondary | ICD-10-CM | POA: Insufficient documentation

## 2015-04-24 DIAGNOSIS — Z87891 Personal history of nicotine dependence: Secondary | ICD-10-CM | POA: Diagnosis not present

## 2015-04-24 MED ORDER — ALBUTEROL SULFATE HFA 108 (90 BASE) MCG/ACT IN AERS: 2.0000 | INHALATION_SPRAY | RESPIRATORY_TRACT | Status: DC | PRN

## 2015-04-24 MED ORDER — AZITHROMYCIN 250 MG PO TABS: ORAL_TABLET | ORAL | Status: DC

## 2015-04-24 NOTE — ED Provider Notes (Signed)
CSN: [id number]     Arrival date & time 04/24/15  0704 History   First MD Initiated Contact with Patient 04/24/15 [phone]     Chief Complaint  Patient presents with  . Cough  . Nasal Congestion     (Consider location/radiation/quality/duration/timing/severity/associated sxs/prior Treatment) Patient is a 29 y.o. female presenting with cough. The history is provided by the patient.  Cough Associated symptoms: fever   Associated symptoms: no chest pain, no chills, no eye discharge, no headaches, no rash, no shortness of breath and no sore throat   Patient w 3 day hx subjective fever, prod cough w brownish sputum.  No sob. No hx chronic lung disease. +smoker. Denies chest pain except to say bil sides sore w coughing spells. No sore throat. Minimal nasal congestion, no sinus pain. No headaches. No neck pain or stiffness. No known ill contacts. No nvd. No abd pain. No leg pain or swelling.       Past Medical History  Diagnosis Date  . Hypertension   . Lymphedema    Past Surgical History  Procedure Laterality Date  . Cesarean section    . Lymphadenectomy     History reviewed. No pertinent family history. Social History  Substance Use Topics  . Smoking status: Former Smoker -- 0.25 packs/day    Types: Cigarettes  . Smokeless tobacco: None  . Alcohol Use: Yes     Comment: occasional   OB History    No data available     Review of Systems  Constitutional: Positive for fever. Negative for chills.  HENT: Negative for sore throat.   Eyes: Negative for discharge and redness.  Respiratory: Positive for cough. Negative for shortness of breath.   Cardiovascular: Negative for chest pain and leg swelling.  Gastrointestinal: Negative for vomiting, abdominal pain and diarrhea.  Genitourinary: Negative for flank pain.  Musculoskeletal: Negative for back pain and neck pain.  Skin: Negative for rash.  Neurological: Negative for syncope, light-headedness and headaches.  Hematological:  Does not bruise/bleed easily.  Psychiatric/Behavioral: Negative for confusion.      Allergies  Penicillins and Sulfa antibiotics  Home Medications   Prior to Admission medications   Medication Sig Start Date End Date Taking? Authorizing Provider  clindamycin (CLEOCIN) 150 MG capsule Take 1 capsule (150 mg total) by mouth every 6 (six) hours. 09/06/14   Burgess AmorJulie Idol, PA-C  HYDROcodone-acetaminophen (NORCO/VICODIN) 5-325 MG per tablet Take 1 tablet by mouth every 4 (four) hours as needed. 09/06/14   Burgess AmorJulie Idol, PA-C   BP 124/80 mmHg  Pulse 104  Temp(Src) 99.1 F (37.3 C) (Oral)  Resp 18  Ht 5\' 4"  (1.626 m)  Wt 210 lb (95.255 kg)  BMI 36.03 kg/m2  SpO2 99% Physical Exam  Constitutional: She appears well-developed and well-nourished. No distress.  HENT:  Nose: Nose normal.  Mouth/Throat: Oropharynx is clear and moist.  Eyes: Conjunctivae are normal. No scleral icterus.  Neck: Neck supple. No tracheal deviation present.  No stiffness or rigidity  Cardiovascular: Normal rate, regular rhythm, normal heart sounds and intact distal pulses.  Exam reveals no gallop and no friction rub.   No murmur heard. Pulmonary/Chest: Effort normal. No respiratory distress.  Scatt rhonchi left base  Abdominal: Soft. Normal appearance and bowel sounds are normal. She exhibits no distension. There is no tenderness.  Genitourinary:  No cva tenderness  Musculoskeletal: She exhibits no edema or tenderness.  Lymphadenopathy:    She has no cervical adenopathy.  Neurological: She is alert.  Skin: Skin  is warm and dry. No rash noted. She is not diaphoretic.  Psychiatric: She has a normal mood and affect.  Nursing note and vitals reviewed.   ED Course  Procedures (including critical care time)   MDM  Symptoms exam c/w bronchtiis, ?early pna.   Confirmed only allergies are pcn and sulfa.  rx zithromax, alb mdi. Smoking cessation rec.   Reviewed nursing notes and prior charts for additional history.    Pt currently appears stable for d/c.  Return precautions provided.        Cathren Laine, MD 04/24/15 [phone]

## 2015-04-24 NOTE — Discharge Instructions (Signed)
It was our pleasure to provide your ER care today - we hope that you feel better.  Rest. Drink plenty of fluids.  Take antibiotic (zithromax) as prescribed.  Use albuterol inhaler as need if wheezing.  Avoid any smoking, or smoky areas.   Try mucinex as need for cough.  Follow up with primary care doctor in the next 3-4 days for recheck if symptoms fail to improve/resolve.  Return to ER if worse, trouble breathing, other concern.     Cough, Adult Coughing is a reflex that clears your throat and your airways. Coughing helps to heal and protect your lungs. It is normal to cough occasionally, but a cough that happens with other symptoms or lasts a long time may be a sign of a condition that needs treatment. A cough may last only 2-3 weeks (acute), or it may last longer than 8 weeks (chronic). CAUSES Coughing is commonly caused by:  Breathing in substances that irritate your lungs.  A viral or bacterial respiratory infection.  Allergies.  Asthma.  Postnasal drip.  Smoking.  Acid backing up from the stomach into the esophagus (gastroesophageal reflux).  Certain medicines.  Chronic lung problems, including COPD (or rarely, lung cancer).  Other medical conditions such as heart failure. HOME CARE INSTRUCTIONS  Pay attention to any changes in your symptoms. Take these actions to help with your discomfort:  Take medicines only as told by your health care provider.  If you were prescribed an antibiotic medicine, take it as told by your health care provider. Do not stop taking the antibiotic even if you start to feel better.  Talk with your health care provider before you take a cough suppressant medicine.  Drink enough fluid to keep your urine clear or pale yellow.  If the air is dry, use a cold steam vaporizer or humidifier in your bedroom or your home to help loosen secretions.  Avoid anything that causes you to cough at work or at home.  If your cough is worse at  night, try sleeping in a semi-upright position.  Avoid cigarette smoke. If you smoke, quit smoking. If you need help quitting, ask your health care provider.  Avoid caffeine.  Avoid alcohol.  Rest as needed. SEEK MEDICAL CARE IF:   You have new symptoms.  You cough up pus.  Your cough does not get better after 2-3 weeks, or your cough gets worse.  You cannot control your cough with suppressant medicines and you are losing sleep.  You develop pain that is getting worse or pain that is not controlled with pain medicines.  You have a fever.  You have unexplained weight loss.  You have night sweats. SEEK IMMEDIATE MEDICAL CARE IF:  You cough up blood.  You have difficulty breathing.  Your heartbeat is very fast.   This information is not intended to replace advice given to you by your health care provider. Make sure you discuss any questions you have with your health care provider.   Document Released: 11/16/2010 Document Revised: 02/08/2015 Document Reviewed: 07/27/2014 Elsevier Interactive Patient Education 2016 [employer]      Acute Bronchitis Bronchitis is inflammation of the airways that extend from the windpipe into the lungs (bronchi). The inflammation often causes mucus to develop. This leads to a cough, which is the most common symptom of bronchitis.  In acute bronchitis, the condition usually develops suddenly and goes away over time, usually in a couple weeks. Smoking, allergies, and asthma can make bronchitis worse. Repeated episodes  of bronchitis may cause further lung problems.  CAUSES Acute bronchitis is most often caused by the same virus that causes a cold. The virus can spread from person to person (contagious) through coughing, sneezing, and touching contaminated objects. SIGNS AND SYMPTOMS   Cough.   Fever.   Coughing up mucus.   Body aches.   Chest congestion.   Chills.   Shortness of breath.   Sore throat.  DIAGNOSIS    Acute bronchitis is usually diagnosed through a physical exam. Your health care provider will also ask you questions about your medical history. Tests, such as chest X-rays, are sometimes done to rule out other conditions.  TREATMENT  Acute bronchitis usually goes away in a couple weeks. Oftentimes, no medical treatment is necessary. Medicines are sometimes given for relief of fever or cough. Antibiotic medicines are usually not needed but may be prescribed in certain situations. In some cases, an inhaler may be recommended to help reduce shortness of breath and control the cough. A cool mist vaporizer may also be used to help thin bronchial secretions and make it easier to clear the chest.  HOME CARE INSTRUCTIONS  Get plenty of rest.   Drink enough fluids to keep your urine clear or pale yellow (unless you have a medical condition that requires fluid restriction). Increasing fluids may help thin your respiratory secretions (sputum) and reduce chest congestion, and it will prevent dehydration.   Take medicines only as directed by your health care provider.  If you were prescribed an antibiotic medicine, finish it all even if you start to feel better.  Avoid smoking and secondhand smoke. Exposure to cigarette smoke or irritating chemicals will make bronchitis worse. If you are a smoker, consider using nicotine gum or skin patches to help control withdrawal symptoms. Quitting smoking will help your lungs heal faster.   Reduce the chances of another bout of acute bronchitis by washing your hands frequently, avoiding people with cold symptoms, and trying not to touch your hands to your mouth, nose, or eyes.   Keep all follow-up visits as directed by your health care provider.  SEEK MEDICAL CARE IF: Your symptoms do not improve after 1 week of treatment.  SEEK IMMEDIATE MEDICAL CARE IF:  You develop an increased fever or chills.   You have chest pain.   You have severe shortness of  breath.  You have bloody sputum.   You develop dehydration.  You faint or repeatedly feel like you are going to pass out.  You develop repeated vomiting.  You develop a severe headache. MAKE SURE YOU:   Understand these instructions.  Will watch your condition.  Will get help right away if you are not doing well or get worse.   This information is not intended to replace advice given to you by your health care provider. Make sure you discuss any questions you have with your health care provider.   Document Released: 06/27/2004 Document Revised: 06/10/2014 Document Reviewed: 11/10/2012 Elsevier Interactive Patient Education 2016 [employer].    Smoking Hazards Smoking cigarettes is extremely bad for your health. Tobacco smoke has over 200 known poisons in it. It contains the poisonous gases nitrogen oxide and carbon monoxide. There are over 60 chemicals in tobacco smoke that cause cancer. Some of the chemicals found in cigarette smoke include:   Cyanide.   Benzene.   Formaldehyde.   Methanol (wood alcohol).   Acetylene (fuel used in welding torches).   Ammonia.  Even smoking lightly shortens your  life expectancy by several years. You can greatly reduce the risk of medical problems for you and your family by stopping now. Smoking is the most preventable cause of death and disease in our society. Within days of quitting smoking, your circulation improves, you decrease the risk of having a heart attack, and your lung capacity improves. There may be some increased phlegm in the first few days after quitting, and it may take months for your lungs to clear up completely. Quitting for 10 years reduces your risk of developing lung cancer to almost that of a nonsmoker.  WHAT ARE THE RISKS OF SMOKING? Cigarette smokers have an increased risk of many serious medical problems, including:  Lung cancer.   Lung disease (such as pneumonia, bronchitis, and emphysema).   Heart  attack and chest pain due to the heart not getting enough oxygen (angina).   Heart disease and peripheral blood vessel disease.   Hypertension.   Stroke.   Oral cancer (cancer of the lip, mouth, or voice box).   Bladder cancer.   Pancreatic cancer.   Cervical cancer.   Pregnancy complications, including premature birth.   Stillbirths and smaller newborn babies, birth defects, and genetic damage to sperm.   Early menopause.   Lower estrogen level for women.   Infertility.   Facial wrinkles.   Blindness.   Increased risk of broken bones (fractures).   Senile dementia.   Stomach ulcers and internal bleeding.   Delayed wound healing and increased risk of complications during surgery. Because of secondhand smoke exposure, children of smokers have an increased risk of the following:   Sudden infant death syndrome (SIDS).   Respiratory infections.   Lung cancer.   Heart disease.   Ear infections.  WHY IS SMOKING ADDICTIVE? Nicotine is the chemical agent in tobacco that is capable of causing addiction or dependence. When you smoke and inhale, nicotine is absorbed rapidly into the bloodstream through your lungs. Both inhaled and noninhaled nicotine may be addictive.  WHAT ARE THE BENEFITS OF QUITTING?  There are many health benefits to quitting smoking. Some are:   The likelihood of developing cancer and heart disease decreases. Health improvements are seen almost immediately.   Blood pressure, pulse rate, and breathing patterns start returning to normal soon after quitting.   People who quit may see an improvement in their overall quality of life.  HOW DO YOU QUIT SMOKING? Smoking is an addiction with both physical and psychological effects, and longtime habits can be hard to change. Your health care provider can recommend:  Programs and community resources, which may include group support, education, or therapy.  Replacement products,  such as patches, gum, and nasal sprays. Use these products only as directed. Do not replace cigarette smoking with electronic cigarettes (commonly called e-cigarettes). The safety of e-cigarettes is unknown, and some may contain harmful chemicals. FOR MORE INFORMATION  American Lung Association: www.lung.org  American Cancer Society: www.cancer.org   This information is not intended to replace advice given to you by your health care provider. Make sure you discuss any questions you have with your health care provider.   Document Released: 06/27/2004 Document Revised: 03/10/2013 Document Reviewed: 11/09/2012 Elsevier Interactive Patient Education [employer]

## 2015-04-24 NOTE — ED Notes (Signed)
Cold symptoms since Friday.  Cough with greenish brown secretions.  C/o nasal congestion.

## 2015-06-27 ENCOUNTER — Emergency Department (HOSPITAL_COMMUNITY): Payer: Medicaid Other

## 2015-06-27 ENCOUNTER — Emergency Department (HOSPITAL_COMMUNITY)
Admission: EM | Admit: 2015-06-27 | Discharge: 2015-06-28 | Disposition: A | Discharge status: Home / Self Care | Payer: Medicaid Other | Attending: Emergency Medicine | Admitting: Emergency Medicine

## 2015-06-27 ENCOUNTER — Encounter (HOSPITAL_COMMUNITY): Payer: Self-pay | Admitting: Emergency Medicine

## 2015-06-27 DIAGNOSIS — N730 Acute parametritis and pelvic cellulitis: Secondary | ICD-10-CM | POA: Insufficient documentation

## 2015-06-27 DIAGNOSIS — R630 Anorexia: Secondary | ICD-10-CM | POA: Diagnosis not present

## 2015-06-27 DIAGNOSIS — I10 Essential (primary) hypertension: Secondary | ICD-10-CM | POA: Diagnosis not present

## 2015-06-27 DIAGNOSIS — Z3202 Encounter for pregnancy test, result negative: Secondary | ICD-10-CM | POA: Diagnosis not present

## 2015-06-27 DIAGNOSIS — Z79899 Other long term (current) drug therapy: Secondary | ICD-10-CM | POA: Diagnosis not present

## 2015-06-27 DIAGNOSIS — R102 Pelvic and perineal pain: Secondary | ICD-10-CM

## 2015-06-27 DIAGNOSIS — Z880 Allergy status to penicillin: Secondary | ICD-10-CM | POA: Insufficient documentation

## 2015-06-27 DIAGNOSIS — R05 Cough: Secondary | ICD-10-CM | POA: Diagnosis not present

## 2015-06-27 DIAGNOSIS — R1031 Right lower quadrant pain: Secondary | ICD-10-CM | POA: Diagnosis present

## 2015-06-27 LAB — CBC WITH DIFFERENTIAL/PLATELET
Basophils Absolute: 0 K/uL (ref 0.0–0.1)
Basophils Relative: 0 %
Eosinophils Absolute: 0 K/uL (ref 0.0–0.7)
Eosinophils Relative: 0 %
HCT: 37.2 % (ref 36.0–46.0)
Hemoglobin: 12 g/dL (ref 12.0–15.0)
Lymphocytes Relative: 22 %
Lymphs Abs: 2.6 K/uL (ref 0.7–4.0)
MCH: 27.2 pg (ref 26.0–34.0)
MCHC: 32.3 g/dL (ref 30.0–36.0)
MCV: 84.4 fL (ref 78.0–100.0)
Monocytes Absolute: 0.8 K/uL (ref 0.1–1.0)
Monocytes Relative: 7 %
Neutro Abs: 8.3 K/uL — ABNORMAL HIGH (ref 1.7–7.7)
Neutrophils Relative %: 71 %
Platelets: 236 K/uL (ref 150–400)
RBC: 4.41 MIL/uL (ref 3.87–5.11)
RDW: 13.9 % (ref 11.5–15.5)
WBC: 11.7 K/uL — ABNORMAL HIGH (ref 4.0–10.5)

## 2015-06-27 LAB — COMPREHENSIVE METABOLIC PANEL WITH GFR
ALT: 16 U/L (ref 14–54)
AST: 19 U/L (ref 15–41)
Albumin: 3.8 g/dL (ref 3.5–5.0)
Alkaline Phosphatase: 41 U/L (ref 38–126)
Anion gap: 7 (ref 5–15)
BUN: 12 mg/dL (ref 6–20)
CO2: 21 mmol/L — ABNORMAL LOW (ref 22–32)
Calcium: 8.7 mg/dL — ABNORMAL LOW (ref 8.9–10.3)
Chloride: 107 mmol/L (ref 101–111)
Creatinine, Ser: 0.96 mg/dL (ref 0.44–1.00)
GFR calc Af Amer: >60 mL/min
GFR calc non Af Amer: >60 mL/min
Glucose, Bld: 131 mg/dL — ABNORMAL HIGH (ref 65–99)
Potassium: 3.8 mmol/L (ref 3.5–5.1)
Sodium: 135 mmol/L (ref 135–145)
Total Bilirubin: 0.5 mg/dL (ref 0.3–1.2)
Total Protein: 6.9 g/dL (ref 6.5–8.1)

## 2015-06-27 LAB — LIPASE, BLOOD: Lipase: 18 U/L (ref 11–51)

## 2015-06-27 LAB — URINALYSIS, ROUTINE W REFLEX MICROSCOPIC
BILIRUBIN URINE: NEGATIVE
Glucose, UA: NEGATIVE mg/dL
Hgb urine dipstick: NEGATIVE
KETONES UR: NEGATIVE mg/dL
NITRITE: NEGATIVE
PH: 5.5 (ref 5.0–8.0)
Protein, ur: NEGATIVE mg/dL
Specific Gravity, Urine: >1.03 — ABNORMAL HIGH (ref 1.005–1.030)

## 2015-06-27 LAB — URINE MICROSCOPIC-ADD ON: RBC / HPF: NONE SEEN RBC/hpf (ref 0–5)

## 2015-06-27 LAB — PREGNANCY, URINE: Preg Test, Ur: NEGATIVE

## 2015-06-27 MED ORDER — ONDANSETRON HCL 4 MG/2ML IJ SOLN
4.0000 mg | Freq: Once | INTRAMUSCULAR | Status: AC
  Administered 2015-06-27: 4 mg via INTRAVENOUS

## 2015-06-27 MED ORDER — HYDROMORPHONE HCL 1 MG/ML IJ SOLN
1.0000 mg | Freq: Once | INTRAMUSCULAR | Status: AC
Start: 2015-06-27 — End: 2015-06-27
  Administered 2015-06-27: 1 mg via INTRAVENOUS

## 2015-06-27 MED ORDER — HYDROMORPHONE HCL 1 MG/ML IJ SOLN
INTRAMUSCULAR | Status: AC
  Filled 2015-06-27: qty 1

## 2015-06-27 MED ORDER — IOHEXOL 300 MG/ML  SOLN
100.0000 mL | Freq: Once | INTRAMUSCULAR | Status: AC | PRN
  Administered 2015-06-27: 100 mL via INTRAVENOUS

## 2015-06-27 MED ORDER — ONDANSETRON HCL 4 MG/2ML IJ SOLN
INTRAMUSCULAR | Status: AC
  Filled 2015-06-27: qty 2

## 2015-06-27 MED ORDER — DIATRIZOATE MEGLUMINE & SODIUM 66-10 % PO SOLN
ORAL | Status: AC
  Filled 2015-06-27: qty 30

## 2015-06-27 NOTE — ED Provider Notes (Signed)
CSN: [id number]     Arrival date & time 06/27/15  2106 History   None    Chief Complaint  Patient presents with  . Abdominal Pain     (Consider location/radiation/quality/duration/timing/severity/associated sxs/prior Treatment) Patient is a 30 y.o. female presenting with abdominal pain. The history is provided by the patient.  Abdominal Pain Pain location:  RLQ and suprapubic Pain quality: pressure   Pain radiates to:  Back Pain severity:  Severe Onset quality:  Gradual Duration:  5 hours Timing:  Constant Progression:  Worsening Chronicity:  New Relieved by:  Nothing Worsened by:  Nothing tried Ineffective treatments:  None tried Associated symptoms: anorexia, chills, cough, flatus and nausea   Associated symptoms: no chest pain, no constipation, no diarrhea, no dysuria, no fever, no hematuria, no shortness of breath, no vaginal bleeding, no vaginal discharge and no vomiting   Multiple surgeries: 2 c/s.    Emma Scott is a 30 y.o. G2 P2 who is not pregnant and has only one sex partner who is female. She has been in this relationship for the past 8 years. She is currently on no medications.   Past Medical History  Diagnosis Date  . Hypertension   . Lymphedema    Past Surgical History  Procedure Laterality Date  . Cesarean section    . Lymphadenectomy     History reviewed. No pertinent family history. Social History  Substance Use Topics  . Smoking status: Former Smoker -- 0.25 packs/day    Types: Cigarettes  . Smokeless tobacco: None  . Alcohol Use: Yes     Comment: occasional   OB History    No data available     Review of Systems  Constitutional: Positive for chills. Negative for fever.  HENT: Negative.   Eyes: Negative for pain, redness and visual disturbance.  Respiratory: Positive for cough. Negative for chest tightness and shortness of breath.   Cardiovascular: Negative for chest pain.  Gastrointestinal: Positive for nausea, abdominal pain, anorexia  and flatus. Negative for vomiting, diarrhea and constipation.  Genitourinary: Negative for dysuria, urgency, hematuria, vaginal bleeding and vaginal discharge.  Musculoskeletal: Positive for back pain. Negative for neck pain and neck stiffness.  Skin: Negative for rash and wound.  Neurological: Negative for dizziness, syncope, light-headedness and headaches.  Psychiatric/Behavioral: Negative for confusion and sleep disturbance. The patient is not nervous/anxious.       Allergies  Penicillins and Sulfa antibiotics  Home Medications   Prior to Admission medications   Medication Sig Start Date End Date Taking? Authorizing Provider  albuterol (PROVENTIL HFA;VENTOLIN HFA) 108 (90 BASE) MCG/ACT inhaler Inhale 2 puffs into the lungs every 4 (four) hours as needed for wheezing or shortness of breath. 04/24/15   Cathren Laine, MD  ciprofloxacin (CIPRO) 500 MG tablet Take 1 tablet (500 mg total) by mouth 2 (two) times daily. 06/28/15   Hope Orlene Och, NP  metroNIDAZOLE (FLAGYL) 500 MG tablet Take 1 tablet (500 mg total) by mouth 2 (two) times daily. 06/28/15   Hope Orlene Och, NP  ondansetron (ZOFRAN) 4 MG tablet Take 1 tablet (4 mg total) by mouth every 6 (six) hours. 06/28/15   Hope Orlene Och, NP  oxyCODONE-acetaminophen (ROXICET) 5-325 MG tablet Take 1 tablet by mouth every 6 (six) hours as needed for severe pain. 06/28/15   Hope Orlene Och, NP   BP 130/76 mmHg  Pulse 83  Temp(Src) 98 F (36.7 C)  Resp 18  Ht  (1.626 m)  Wt 97.977 kg  BMI  37.06 kg/m2  SpO2 98%  LMP 06/13/2015 Physical Exam  Constitutional: She is oriented to person, place, and time. She appears well-developed and well-nourished. No distress.  HENT:  Head: Normocephalic.  Eyes: Conjunctivae and EOM are normal. Pupils are equal, round, and reactive to light.  Neck: Normal range of motion. Neck supple.  Cardiovascular: Normal rate and regular rhythm.   Pulmonary/Chest: Effort normal and breath sounds normal.  Abdominal: Soft.  Bowel sounds are normal. There is tenderness. There is guarding. There is no rebound.  Genitourinary:  External genitalia without lesions, positive CMT, right adnexal tenderness, uterus without palpable enlargement.   Musculoskeletal: Normal range of motion.  Neurological: She is alert and oriented to person, place, and time. No cranial nerve deficit.  Skin: Skin is warm and dry.  Psychiatric: She has a normal mood and affect. Her behavior is normal.  Nursing note and vitals reviewed.   ED Course: consult with Dr. Despina Hidden will treat with Rocephin 1 gram IV, Cipro, Flagyl, pain management and medication for nausea. Will try out patient treatment if patient can tolerate medications.    Procedures (including critical care time) Labs Review Results for orders placed or performed during the hospital encounter of 06/27/15 (from the past 24 hour(s))  Pregnancy, urine     Status: None   Collection Time: 06/27/15  9:40 PM  Result Value Ref Range   Preg Test, Ur NEGATIVE NEGATIVE  Urinalysis, Routine w reflex microscopic (not at [facility])     Status: Abnormal   Collection Time: 06/27/15  9:40 PM  Result Value Ref Range   Color, Urine YELLOW YELLOW   APPearance CLEAR CLEAR   Specific Gravity, Urine >1.030 (H) 1.005 - 1.030   pH 5.5 5.0 - 8.0   Glucose, UA NEGATIVE NEGATIVE mg/dL   Hgb urine dipstick NEGATIVE NEGATIVE   Bilirubin Urine NEGATIVE NEGATIVE   Ketones, ur NEGATIVE NEGATIVE mg/dL   Protein, ur NEGATIVE NEGATIVE mg/dL   Nitrite NEGATIVE NEGATIVE   Leukocytes, UA TRACE (A) NEGATIVE  Urine microscopic-add on     Status: Abnormal   Collection Time: 06/27/15  9:40 PM  Result Value Ref Range   Squamous Epithelial / LPF 0-5 (A) NONE SEEN   WBC, UA 0-5 0 - 5 WBC/hpf   RBC / HPF NONE SEEN 0 - 5 RBC/hpf   Bacteria, UA FEW (A) NONE SEEN   Urine-Other AMORPHOUS URATES/PHOSPHATES   CBC with Differential     Status: Abnormal   Collection Time: 06/27/15  9:45 PM  Result Value Ref Range   WBC  11.7 (H) 4.0 - 10.5 K/uL   RBC 4.41 3.87 - 5.11 MIL/uL   Hemoglobin 12.0 12.0 - 15.0 g/dL   HCT 16.1 09.6 - 04.5 %   MCV 84.4 78.0 - 100.0 fL   MCH 27.2 26.0 - 34.0 pg   MCHC 32.3 30.0 - 36.0 g/dL   RDW 40.9 81.1 - 91.4 %   Platelets 236 150 - 400 K/uL   Neutrophils Relative % 71 %   Neutro Abs 8.3 (H) 1.7 - 7.7 K/uL   Lymphocytes Relative 22 %   Lymphs Abs 2.6 0.7 - 4.0 K/uL   Monocytes Relative 7 %   Monocytes Absolute 0.8 0.1 - 1.0 K/uL   Eosinophils Relative 0 %   Eosinophils Absolute 0.0 0.0 - 0.7 K/uL   Basophils Relative 0 %   Basophils Absolute 0.0 0.0 - 0.1 K/uL  Comprehensive metabolic panel     Status: Abnormal   Collection Time:  06/27/15  9:45 PM  Result Value Ref Range   Sodium 135 135 - 145 mmol/L   Potassium 3.8 3.5 - 5.1 mmol/L   Chloride 107 101 - 111 mmol/L   CO2 21 (L) 22 - 32 mmol/L   Glucose, Bld 131 (H) 65 - 99 mg/dL   BUN 12 6 - 20 mg/dL   Creatinine, Ser 9.14 0.44 - 1.00 mg/dL   Calcium 8.7 (L) 8.9 - 10.3 mg/dL   Total Protein 6.9 6.5 - 8.1 g/dL   Albumin 3.8 3.5 - 5.0 g/dL   AST 19 15 - 41 U/L   ALT 16 14 - 54 U/L   Alkaline Phosphatase 41 38 - 126 U/L   Total Bilirubin 0.5 0.3 - 1.2 mg/dL   GFR calc non Af Amer >60 >60 mL/min   GFR calc Af Amer >60 >60 mL/min   Anion gap 7 5 - 15  Lipase, blood     Status: None   Collection Time: 06/27/15  9:45 PM  Result Value Ref Range   Lipase 18 11 - 51 U/L  Wet prep, genital     Status: Abnormal   Collection Time: 06/28/15 12:05 AM  Result Value Ref Range   Yeast Wet Prep HPF POC NONE SEEN NONE SEEN   Trich, Wet Prep NONE SEEN NONE SEEN   Clue Cells Wet Prep HPF POC PRESENT (A) NONE SEEN   WBC, Wet Prep HPF POC FEW (A) NONE SEEN   Sperm NONE SEEN      Imaging Review Ct Abdomen Pelvis W Contrast  06/27/2015  CLINICAL DATA:  Right lower quadrant pain and nausea for 2 days. Initial encounter. EXAM: CT ABDOMEN AND PELVIS WITH CONTRAST TECHNIQUE: Multidetector CT imaging of the abdomen and pelvis was  performed using the standard protocol following bolus administration of intravenous contrast. CONTRAST:  100 mL OMNIPAQUE IOHEXOL 300 MG/ML  SOLN COMPARISON:  None. FINDINGS: The lung bases are clear.  No pleural or pericardial effusion. There is a small volume of perihepatic and perisplenic fluid. Stranding and omental fat is identified in the pelvis. The right adnexa has a boggy appearance with ill-defined areas of low attenuation present. The left ovary is unremarkable. The stomach and small and large bowel appear normal. Structure thought represent the appendix as seen on axial images 49-51 appears normal. The gallbladder, liver, spleen, adrenal glands, pancreas and kidneys appear normal. There is no lymphadenopathy. No bony abnormality is identified. IMPRESSION: Findings most consistent with pelvic inflammatory disease and possible tubo-ovarian abscess on the right. Recommend correlation with pelvic examination. Pelvic ultrasound could also be used for further evaluation. Electronically Signed   By: Drusilla Kanner M.D.   On: 06/27/2015 23:31   I have personally reviewed and evaluated these images and lab results as part of my medical decision-making.   MDM  30 y.o. female with pelvic pain and CT that shows PID and possible TOA. Patient continues to have pain despite IV pain medication and medication for nausea.  Call to ultrasound and they will be in to do pelvic ultrasound to assess for possible torsion.  Dr. Bebe Shaggy will assume care of the patient. Plan to d/c home with antibiotics and pain medication, medication for nausea.   Final diagnoses:  PID (acute pelvic inflammatory disease)  TOA (tubo-ovarian abscess)  Pelvic pain in female       Caro, NP 06/28/15 0147  Zadie Rhine, MD 06/28/15 [phone]

## 2015-06-27 NOTE — ED Notes (Signed)
Pt c/o rt lower abd pain that radiates to back with nausea.

## 2015-06-28 ENCOUNTER — Emergency Department (HOSPITAL_COMMUNITY): Payer: Medicaid Other

## 2015-06-28 LAB — WET PREP, GENITAL
SPERM: NONE SEEN
TRICH WET PREP: NONE SEEN
YEAST WET PREP: NONE SEEN

## 2015-06-28 MED ORDER — ONDANSETRON HCL 4 MG/2ML IJ SOLN
4.0000 mg | Freq: Once | INTRAMUSCULAR | Status: AC
  Administered 2015-06-28: 4 mg via INTRAVENOUS
  Filled 2015-06-28: qty 2

## 2015-06-28 MED ORDER — KETOROLAC TROMETHAMINE 30 MG/ML IJ SOLN
30.0000 mg | Freq: Once | INTRAMUSCULAR | Status: AC
  Administered 2015-06-28: 30 mg via INTRAVENOUS
  Filled 2015-06-28: qty 1

## 2015-06-28 MED ORDER — MORPHINE SULFATE (PF) 4 MG/ML IV SOLN
4.0000 mg | Freq: Once | INTRAVENOUS | Status: AC
  Administered 2015-06-28: 4 mg via INTRAVENOUS
  Filled 2015-06-28: qty 1

## 2015-06-28 MED ORDER — DEXTROSE 5 % IV SOLN
1.0000 g | Freq: Once | INTRAVENOUS | Status: AC
  Administered 2015-06-28: 1 g via INTRAVENOUS
  Filled 2015-06-28: qty 10

## 2015-06-28 MED ORDER — OXYCODONE-ACETAMINOPHEN 5-325 MG PO TABS: 1.0000 | ORAL_TABLET | Freq: Four times a day (QID) | ORAL | Status: DC | PRN

## 2015-06-28 MED ORDER — METRONIDAZOLE 500 MG PO TABS
500.0000 mg | ORAL_TABLET | Freq: Once | ORAL | Status: AC
  Administered 2015-06-28: 500 mg via ORAL
  Filled 2015-06-28: qty 1

## 2015-06-28 MED ORDER — CIPROFLOXACIN HCL 500 MG PO TABS: 500.0000 mg | ORAL_TABLET | Freq: Two times a day (BID) | ORAL | Status: DC

## 2015-06-28 MED ORDER — PROMETHAZINE HCL 25 MG/ML IJ SOLN
12.5000 mg | Freq: Once | INTRAMUSCULAR | Status: AC
  Administered 2015-06-28: 12.5 mg via INTRAVENOUS
  Filled 2015-06-28: qty 1

## 2015-06-28 MED ORDER — ONDANSETRON HCL 4 MG PO TABS: 4.0000 mg | ORAL_TABLET | Freq: Four times a day (QID) | ORAL | Status: DC

## 2015-06-28 MED ORDER — OXYCODONE-ACETAMINOPHEN 5-325 MG PO TABS
1.0000 | ORAL_TABLET | Freq: Once | ORAL | Status: AC
  Administered 2015-06-28: 1 via ORAL
  Filled 2015-06-28: qty 1

## 2015-06-28 MED ORDER — METRONIDAZOLE 500 MG PO TABS: 500.0000 mg | ORAL_TABLET | Freq: Two times a day (BID) | ORAL | Status: DC

## 2015-06-28 MED ORDER — CIPROFLOXACIN HCL 250 MG PO TABS
500.0000 mg | ORAL_TABLET | Freq: Once | ORAL | Status: AC
  Administered 2015-06-28: 500 mg via ORAL
  Filled 2015-06-28: qty 2

## 2015-06-28 NOTE — ED Notes (Signed)
Pt's sister came out of room stating pt was asleep and she suddenly woke up holding her abdomen and stating it felt like she was having a baby; Dr. Bebe Shaggy informed and orders given

## 2015-06-28 NOTE — Discharge Instructions (Signed)
Take the medication as directed. It is important that you follow up with Dr. Despina Hidden or with Dr. Francee Piccolo for continued care of your infection.

## 2015-06-28 NOTE — ED Notes (Signed)
Pt alert & oriented x4, stable gait. Patient given discharge instructions, paperwork & prescription(s). Patient informed not to drive, operate any equipment & handel any important documents 4 hours after taking pain medication. Patient instructed to stop at the registration desk to finish any additional paperwork. Patient verbalized understanding. Pt left department by wheelchair, escorted by staff. Pt left w/ no further questions.

## 2015-06-29 LAB — GC/CHLAMYDIA PROBE AMP (~~LOC~~) NOT AT ARMC
Chlamydia: NEGATIVE
NEISSERIA GONORRHEA: NEGATIVE

## 2015-07-03 ENCOUNTER — Encounter (HOSPITAL_COMMUNITY): Payer: Self-pay | Admitting: Emergency Medicine

## 2015-07-03 ENCOUNTER — Emergency Department (HOSPITAL_COMMUNITY)
Admission: EM | Admit: 2015-07-03 | Discharge: 2015-07-03 | Disposition: A | Discharge status: Home / Self Care | Payer: Medicaid Other | Attending: Emergency Medicine | Admitting: Emergency Medicine

## 2015-07-03 ENCOUNTER — Emergency Department (HOSPITAL_COMMUNITY): Payer: Medicaid Other

## 2015-07-03 DIAGNOSIS — R5383 Other fatigue: Secondary | ICD-10-CM | POA: Insufficient documentation

## 2015-07-03 DIAGNOSIS — Z792 Long term (current) use of antibiotics: Secondary | ICD-10-CM | POA: Diagnosis not present

## 2015-07-03 DIAGNOSIS — Z8679 Personal history of other diseases of the circulatory system: Secondary | ICD-10-CM | POA: Insufficient documentation

## 2015-07-03 DIAGNOSIS — Z87891 Personal history of nicotine dependence: Secondary | ICD-10-CM | POA: Insufficient documentation

## 2015-07-03 DIAGNOSIS — N83529 Torsion of fallopian tube, unspecified side: Secondary | ICD-10-CM

## 2015-07-03 DIAGNOSIS — I10 Essential (primary) hypertension: Secondary | ICD-10-CM | POA: Insufficient documentation

## 2015-07-03 DIAGNOSIS — R109 Unspecified abdominal pain: Secondary | ICD-10-CM

## 2015-07-03 DIAGNOSIS — Z880 Allergy status to penicillin: Secondary | ICD-10-CM | POA: Insufficient documentation

## 2015-07-03 DIAGNOSIS — N838 Other noninflammatory disorders of ovary, fallopian tube and broad ligament: Secondary | ICD-10-CM | POA: Diagnosis not present

## 2015-07-03 DIAGNOSIS — R197 Diarrhea, unspecified: Secondary | ICD-10-CM | POA: Insufficient documentation

## 2015-07-03 LAB — BASIC METABOLIC PANEL
Anion gap: 7 (ref 5–15)
BUN: 8 mg/dL (ref 6–20)
CO2: 24 mmol/L (ref 22–32)
CREATININE: 0.68 mg/dL (ref 0.44–1.00)
Calcium: 8.5 mg/dL — ABNORMAL LOW (ref 8.9–10.3)
Chloride: 105 mmol/L (ref 101–111)
Glucose, Bld: 86 mg/dL (ref 65–99)
POTASSIUM: 3.7 mmol/L (ref 3.5–5.1)
SODIUM: 136 mmol/L (ref 135–145)

## 2015-07-03 LAB — CBC WITH DIFFERENTIAL/PLATELET
BASOS ABS: 0.1 10*3/uL (ref 0.0–0.1)
Basophils Relative: 1 %
EOS ABS: 0.2 10*3/uL (ref 0.0–0.7)
EOS PCT: 2 %
HCT: 35.8 % — ABNORMAL LOW (ref 36.0–46.0)
Hemoglobin: 11.6 g/dL — ABNORMAL LOW (ref 12.0–15.0)
Lymphocytes Relative: 37 %
Lymphs Abs: 3.2 10*3/uL (ref 0.7–4.0)
MCH: 27.8 pg (ref 26.0–34.0)
MCHC: 32.4 g/dL (ref 30.0–36.0)
MCV: 85.6 fL (ref 78.0–100.0)
Monocytes Absolute: 0.7 10*3/uL (ref 0.1–1.0)
Monocytes Relative: 8 %
NEUTROS PCT: 52 %
Neutro Abs: 4.6 10*3/uL (ref 1.7–7.7)
PLATELETS: 269 10*3/uL (ref 150–400)
RBC: 4.18 MIL/uL (ref 3.87–5.11)
RDW: 14 % (ref 11.5–15.5)
WBC: 8.7 10*3/uL (ref 4.0–10.5)

## 2015-07-03 LAB — URINALYSIS, ROUTINE W REFLEX MICROSCOPIC
Bilirubin Urine: NEGATIVE
GLUCOSE, UA: NEGATIVE mg/dL
Hgb urine dipstick: NEGATIVE
KETONES UR: NEGATIVE mg/dL
LEUKOCYTES UA: NEGATIVE
NITRITE: NEGATIVE
PH: 7 (ref 5.0–8.0)
PROTEIN: NEGATIVE mg/dL
Specific Gravity, Urine: <1.005 — ABNORMAL LOW (ref 1.005–1.030)

## 2015-07-03 LAB — SEDIMENTATION RATE: Sed Rate: 40 mm/hr — ABNORMAL HIGH (ref 0–22)

## 2015-07-03 MED ORDER — OXYCODONE-ACETAMINOPHEN 5-325 MG PO TABS: 1.0000 | ORAL_TABLET | ORAL | Status: DC | PRN

## 2015-07-03 MED ORDER — MORPHINE SULFATE (PF) 4 MG/ML IV SOLN
4.0000 mg | Freq: Once | INTRAVENOUS | Status: AC
  Administered 2015-07-03: 4 mg via INTRAVENOUS
  Filled 2015-07-03: qty 1

## 2015-07-03 MED ORDER — ONDANSETRON HCL 4 MG/2ML IJ SOLN
4.0000 mg | Freq: Once | INTRAMUSCULAR | Status: AC
  Administered 2015-07-03: 4 mg via INTRAVENOUS
  Filled 2015-07-03: qty 2

## 2015-07-03 MED ORDER — MORPHINE SULFATE (PF) 4 MG/ML IV SOLN
4.0000 mg | Freq: Once | INTRAVENOUS | Status: AC
Start: 2015-07-03 — End: 2015-07-03
  Administered 2015-07-03: 4 mg via INTRAVENOUS
  Filled 2015-07-03: qty 1

## 2015-07-03 MED ORDER — SODIUM CHLORIDE 0.9 % IV BOLUS (SEPSIS)
1000.0000 mL | Freq: Once | INTRAVENOUS | Status: AC
  Administered 2015-07-03: 1000 mL via INTRAVENOUS

## 2015-07-03 NOTE — ED Notes (Signed)
Pt reports being seen in ED last week and diagnosed with pelvic inflammatory disease.  Pt was supposed to have follow up on Wednesday but is continuing to have persistent right flank pain radiating into right lower abdomen.  Pt reports diarrhea for a few days, denies nausea.  Pt also reports a "pulling" feeling on her umbilicus.

## 2015-07-03 NOTE — Discharge Instructions (Signed)
°  SEEK IMMEDIATE MEDICAL ATTENTION IF: °A temperature above 100.4F develops.  °Repeated vomiting occurs (multiple episodes).  °The pain becomes localized to portions of the abdomen. The right side could possibly be appendicitis. In an adult, the left lower portion of the abdomen could be colitis or diverticulitis.  °Blood is being passed in stools or vomit (bright red or black tarry stools).  °Return also if you develop chest pain, difficulty breathing, dizziness or fainting, or become confused, poorly responsive, or inconsolable. ° °

## 2015-07-03 NOTE — ED Provider Notes (Signed)
CSN: [id number]     Arrival date & time 07/03/15  0704 History  By signing my name below, I, Phillis Haggis, attest that this documentation has been prepared under the direction and in the presence of Zadie Rhine, MD. Electronically Signed: Phillis Haggis, ED Scribe. 07/03/2015. 11:18 AM.   Chief Complaint  Patient presents with  . Flank Pain   Patient is a 30 y.o. female presenting with flank pain. The history is provided by the patient. No language interpreter was used.  Flank Pain This is a new problem. The current episode started more than 1 week ago. The problem occurs constantly. The problem has been gradually worsening. Associated symptoms include abdominal pain. She has tried nothing for the symptoms.  HPI Comments: Emma Scott is a 30 y.o. Female with a hx of HTN who presents to the Emergency Department complaining of constant gradually worsening right flank pain that radiates to the RLQ onset 5 days ago. Pt was seen on 06/28/15 for pelvic pain and was diagnosed with PID. Pt is due to have a follow up on Wednesday, but states that the pain has continued. She states that she has been taking her anti-biotics and the lower abdominal pain has subsided. She reports associated watery diarrhea, fatigue due to pain, and a "pulling" sensation in the umbilicus. She denies fever, chills, nausea, vomiting, chest pain, dysuria, vaginal bleeding, numbness, or weakness.    Past Medical History  Diagnosis Date  . Hypertension   . Lymphedema    Past Surgical History  Procedure Laterality Date  . Cesarean section    . Lymphadenectomy     History reviewed. No pertinent family history. Social History  Substance Use Topics  . Smoking status: Former Smoker -- 0.25 packs/day    Types: Cigarettes  . Smokeless tobacco: None  . Alcohol Use: Yes     Comment: occasional   OB History    No data available     Review of Systems  Constitutional: Positive for fatigue. Negative for fever and  chills.  Gastrointestinal: Positive for abdominal pain and diarrhea. Negative for nausea and vomiting.  Genitourinary: Positive for flank pain. Negative for vaginal bleeding.  Neurological: Negative for weakness and numbness.  All other systems reviewed and are negative.   Allergies  Penicillins and Sulfa antibiotics  Home Medications   Prior to Admission medications   Medication Sig Start Date End Date Taking? Authorizing Provider  ciprofloxacin (CIPRO) 500 MG tablet Take 1 tablet (500 mg total) by mouth 2 (two) times daily. 06/28/15  Yes Hope Orlene Och, NP  metroNIDAZOLE (FLAGYL) 500 MG tablet Take 1 tablet (500 mg total) by mouth 2 (two) times daily. 06/28/15  Yes Hope Orlene Och, NP  ondansetron (ZOFRAN) 4 MG tablet Take 1 tablet (4 mg total) by mouth every 6 (six) hours. 06/28/15  Yes Hope Orlene Och, NP  oxyCODONE-acetaminophen (ROXICET) 5-325 MG tablet Take 1 tablet by mouth every 6 (six) hours as needed for severe pain. 06/28/15  Yes Hope Orlene Och, NP  albuterol (PROVENTIL HFA;VENTOLIN HFA) 108 (90 BASE) MCG/ACT inhaler Inhale 2 puffs into the lungs every 4 (four) hours as needed for wheezing or shortness of breath. Patient not taking: Reported on 07/03/2015 04/24/15   Cathren Laine, MD   BP 112/63 mmHg  Pulse 87  Temp(Src) 97.8 F (36.6 C) (Oral)  Resp 16  Ht 5\' 4"  (1.626 m)  Wt 216 lb (97.977 kg)  BMI 37.06 kg/m2  SpO2 100%  LMP 06/13/2015 Physical Exam CONSTITUTIONAL: Well  developed/well nourished HEAD: Normocephalic/atraumatic EYES: EOMI/PERRL ENMT: Mucous membranes moist NECK: supple no meningeal signs SPINE/BACK:entire spine nontender CV: S1/S2 noted, no murmurs/rubs/gallops noted LUNGS: Lungs are clear to auscultation bilaterally, no apparent distress ABDOMEN: soft, nontender, no rebound or guarding, bowel sounds noted throughout abdomen ZO:XWRUE cva tenderness NEURO: Pt is awake/alert/appropriate, moves all extremitiesx4.  No facial droop.  No focal motor deficits in lower  extremties EXTREMITIES: pulses normal/equal, full ROM SKIN: warm, color normal PSYCH: no abnormalities of mood noted, alert and oriented to situation  ED Course  Procedures  DIAGNOSTIC STUDIES: Oxygen Saturation is 100% on RA, normal by my interpretation.    COORDINATION OF CARE: 8:45 AM-Discussed treatment plan which includes [place], labs, morphine and Zofran with pt at bedside and pt agreed to plan.  11:04 AM Discussed findings with dr Emelda Fear He recommends adding on sed rate If she has GYN f/u this week, safe for d/c home and her GYN can f/u on findings, may need MRI as outpatient but not emergently  Pt otherwise safe for d/c home New pain meds given She should continue oral ABX No other signs of acute abdominal emergency Advised need to see her GYN in [place] this week Printed records of her US findings from last visit and today's visit   Labs Review Labs Reviewed  BASIC METABOLIC PANEL - Abnormal; Notable for the following:    Calcium 8.5 (*)    All other components within normal limits  CBC WITH DIFFERENTIAL/PLATELET - Abnormal; Notable for the following:    Hemoglobin 11.6 (*)    HCT 35.8 (*)    All other components within normal limits  URINALYSIS, ROUTINE W REFLEX MICROSCOPIC (NOT AT [facility]) - Abnormal; Notable for the following:    Specific Gravity, Urine <1.005 (*)    All other components within normal limits  SEDIMENTATION RATE    Imaging Review US Transvaginal Non-ob  07/03/2015  CLINICAL DATA:  Fallopian tube portion EXAM: TRANSABDOMINAL AND TRANSVAGINAL ULTRASOUND OF PELVIS DOPPLER ULTRASOUND OF OVARIES TECHNIQUE: Both transabdominal and transvaginal ultrasound examinations of the pelvis were performed. Transabdominal technique was performed for global imaging of the pelvis including uterus, ovaries, adnexal regions, and pelvic cul-de-sac. It was necessary to proceed with endovaginal exam following the transabdominal exam to visualize the endometrium and ovaries.  Color and duplex Doppler ultrasound was utilized to evaluate blood flow to the ovaries. COMPARISON:  06/28/2015 FINDINGS: Uterus Measurements: 9.2 x 4.3 x 5.5 cm. 1.8 x 1.3 x 2.4 cm hypoechoic left uterine mass most consistent with a fibroid. Endometrium Thickness: 12 mm.  No focal abnormality visualized. Right ovary Measurements: 3.7 x 4.5 x 3.7 cm. Complex ill defined right adnexal mass is noted abutting the right ovary which may reflect an exophytic ovarian mass versus paraovarian measuring approximately 3.4 x 3.1 cm. Left ovary Measurements: 2.6 x 3.9 x 2.6 cm. 5.6 x 3.4 x 4.1 cm complex left adnexal mass with multiple internal septations which abuts the left ovary and may reflect a exophytic mass versus paraovarian mass. Pulsed Doppler evaluation of both ovaries demonstrates normal low-resistance arterial and venous waveforms. Other findings No abnormal free fluid. IMPRESSION: 1. Bilateral complex adnexal masses which abut the ovaries which may reflect exophytic ovarian masses versus parovarian masses. Differential considerations include neoplasm versus hemorrhagic cyst/endometriomas versus tubo-ovarian abscess. Recommend further characterization with MRI of the pelvis given the size of the masses and relative difficulty in completely imaging the right adnexal area. Electronically Signed   By: Elige Ko   On: 07/03/2015 10:42  US Pelvis Complete  07/03/2015  CLINICAL DATA:  Fallopian tube portion EXAM: TRANSABDOMINAL AND TRANSVAGINAL ULTRASOUND OF PELVIS DOPPLER ULTRASOUND OF OVARIES TECHNIQUE: Both transabdominal and transvaginal ultrasound examinations of the pelvis were performed. Transabdominal technique was performed for global imaging of the pelvis including uterus, ovaries, adnexal regions, and pelvic cul-de-sac. It was necessary to proceed with endovaginal exam following the transabdominal exam to visualize the endometrium and ovaries. Color and duplex Doppler ultrasound was utilized to evaluate  blood flow to the ovaries. COMPARISON:  06/28/2015 FINDINGS: Uterus Measurements: 9.2 x 4.3 x 5.5 cm. 1.8 x 1.3 x 2.4 cm hypoechoic left uterine mass most consistent with a fibroid. Endometrium Thickness: 12 mm.  No focal abnormality visualized. Right ovary Measurements: 3.7 x 4.5 x 3.7 cm. Complex ill defined right adnexal mass is noted abutting the right ovary which may reflect an exophytic ovarian mass versus paraovarian measuring approximately 3.4 x 3.1 cm. Left ovary Measurements: 2.6 x 3.9 x 2.6 cm. 5.6 x 3.4 x 4.1 cm complex left adnexal mass with multiple internal septations which abuts the left ovary and may reflect a exophytic mass versus paraovarian mass. Pulsed Doppler evaluation of both ovaries demonstrates normal low-resistance arterial and venous waveforms. Other findings No abnormal free fluid. IMPRESSION: 1. Bilateral complex adnexal masses which abut the ovaries which may reflect exophytic ovarian masses versus parovarian masses. Differential considerations include neoplasm versus hemorrhagic cyst/endometriomas versus tubo-ovarian abscess. Recommend further characterization with MRI of the pelvis given the size of the masses and relative difficulty in completely imaging the right adnexal area. Electronically Signed   By: Elige Ko   On: 07/03/2015 10:42   [place] Art/ven Flow Abd Pelv Doppler  07/03/2015  CLINICAL DATA:  Fallopian tube portion EXAM: TRANSABDOMINAL AND TRANSVAGINAL ULTRASOUND OF PELVIS DOPPLER ULTRASOUND OF OVARIES TECHNIQUE: Both transabdominal and transvaginal ultrasound examinations of the pelvis were performed. Transabdominal technique was performed for global imaging of the pelvis including uterus, ovaries, adnexal regions, and pelvic cul-de-sac. It was necessary to proceed with endovaginal exam following the transabdominal exam to visualize the endometrium and ovaries. Color and duplex Doppler ultrasound was utilized to evaluate blood flow to the ovaries. COMPARISON:   06/28/2015 FINDINGS: Uterus Measurements: 9.2 x 4.3 x 5.5 cm. 1.8 x 1.3 x 2.4 cm hypoechoic left uterine mass most consistent with a fibroid. Endometrium Thickness: 12 mm.  No focal abnormality visualized. Right ovary Measurements: 3.7 x 4.5 x 3.7 cm. Complex ill defined right adnexal mass is noted abutting the right ovary which may reflect an exophytic ovarian mass versus paraovarian measuring approximately 3.4 x 3.1 cm. Left ovary Measurements: 2.6 x 3.9 x 2.6 cm. 5.6 x 3.4 x 4.1 cm complex left adnexal mass with multiple internal septations which abuts the left ovary and may reflect a exophytic mass versus paraovarian mass. Pulsed Doppler evaluation of both ovaries demonstrates normal low-resistance arterial and venous waveforms. Other findings No abnormal free fluid. IMPRESSION: 1. Bilateral complex adnexal masses which abut the ovaries which may reflect exophytic ovarian masses versus parovarian masses. Differential considerations include neoplasm versus hemorrhagic cyst/endometriomas versus tubo-ovarian abscess. Recommend further characterization with MRI of the pelvis given the size of the masses and relative difficulty in completely imaging the right adnexal area. Electronically Signed   By: Elige Ko   On: 07/03/2015 10:42   I have personally reviewed and evaluated these  lab results as part of my medical decision-making.   Medications  morphine 4 MG/ML injection 4 mg (4 mg Intravenous Given 07/03/15 0856)  ondansetron [facility]) injection 4 mg (4 mg Intravenous Given 07/03/15 0856)  sodium chloride 0.9 % bolus 1,000 mL (0 mLs Intravenous Stopped 07/03/15 1039)  morphine 4 MG/ML injection 4 mg (4 mg Intravenous Given 07/03/15 1039)     MDM   Final diagnoses:  Flank pain  Ovarian mass, left  Ovarian mass, right    Nursing notes including past medical history and social history reviewed and considered in documentation Labs/vital reviewed myself and considered during evaluation  I  personally performed the services described in this documentation, which was scribed in my presence. The recorded information has been reviewed and is accurate.       Zadie Rhine, MD 07/03/15 1120

## 2015-12-11 ENCOUNTER — Encounter (HOSPITAL_COMMUNITY): Payer: Self-pay | Admitting: Emergency Medicine

## 2015-12-11 ENCOUNTER — Emergency Department (HOSPITAL_COMMUNITY)
Admission: EM | Admit: 2015-12-11 | Discharge: 2015-12-11 | Disposition: A | Discharge status: Home / Self Care | Payer: Medicaid Other | Attending: Emergency Medicine | Admitting: Emergency Medicine

## 2015-12-11 DIAGNOSIS — I10 Essential (primary) hypertension: Secondary | ICD-10-CM | POA: Diagnosis not present

## 2015-12-11 DIAGNOSIS — R599 Enlarged lymph nodes, unspecified: Secondary | ICD-10-CM | POA: Insufficient documentation

## 2015-12-11 DIAGNOSIS — R591 Generalized enlarged lymph nodes: Secondary | ICD-10-CM

## 2015-12-11 DIAGNOSIS — Z87891 Personal history of nicotine dependence: Secondary | ICD-10-CM | POA: Diagnosis not present

## 2015-12-11 DIAGNOSIS — M7989 Other specified soft tissue disorders: Secondary | ICD-10-CM | POA: Diagnosis present

## 2015-12-11 DIAGNOSIS — Z79899 Other long term (current) drug therapy: Secondary | ICD-10-CM | POA: Insufficient documentation

## 2015-12-11 NOTE — Discharge Instructions (Signed)

## 2015-12-11 NOTE — ED Notes (Signed)
Per PA, verbal order to placed compression hose.  Contacted materials, no answer, left voicemail.

## 2015-12-11 NOTE — ED Notes (Signed)
Lupita LeashDonna, from materials, reported would bring knee high TED Hose to pt bedside. PA aware.

## 2015-12-11 NOTE — ED Notes (Signed)
Pt states she has a hx of lymphedema in left leg, but has been more swollen and was leaking clear fluid last night.

## 2015-12-11 NOTE — ED Provider Notes (Signed)
CSN: [id number]     Arrival date & time 12/11/15  [id number] History   First MD Initiated Contact with Patient 12/11/15 0825     Chief Complaint  Patient presents with  . Leg Swelling     (Consider location/radiation/quality/duration/timing/severity/associated sxs/prior Treatment) Patient is a 30 y.o. female presenting with leg pain. The history is provided by the patient. No language interpreter was used.  Leg Pain Location:  Leg Injury: no   Leg location:  L leg and L lower leg Pain details:    Quality:  Aching   Radiates to:  Does not radiate   Severity:  Moderate   Onset quality:  Gradual   Timing:  Constant   Progression:  Worsening Chronicity:  Recurrent Dislocation: no   Foreign body present:  No foreign bodies Tetanus status:  Up to date Prior injury to area:  No Relieved by:  Nothing Worsened by:  Nothing tried Ineffective treatments:  None tried Associated symptoms: no numbness   Risk factors: no recent illness   Pt has had a lymphedema since having surgery several years ago.  Pt reports increased swelling from sitting at work.   Past Medical History  Diagnosis Date  . Hypertension   . Lymphedema    Past Surgical History  Procedure Laterality Date  . Cesarean section    . Lymphadenectomy     History reviewed. No pertinent family history. Social History  Substance Use Topics  . Smoking status: Former Smoker -- 0.25 packs/day    Types: Cigarettes  . Smokeless tobacco: None  . Alcohol Use: Yes     Comment: occasional   OB History    No data available     Review of Systems  All other systems reviewed and are negative.     Allergies  Penicillins and Sulfa antibiotics  Home Medications   Prior to Admission medications   Medication Sig Start Date End Date Taking? Authorizing Provider  ciprofloxacin (CIPRO) 500 MG tablet Take 1 tablet (500 mg total) by mouth 2 (two) times daily. 06/28/15   Hope Orlene OchM Neese, NP  metroNIDAZOLE (FLAGYL) 500 MG tablet Take 1  tablet (500 mg total) by mouth 2 (two) times daily. 06/28/15   Hope Orlene OchM Neese, NP  ondansetron (ZOFRAN) 4 MG tablet Take 1 tablet (4 mg total) by mouth every 6 (six) hours. 06/28/15   Hope Orlene OchM Neese, NP  oxyCODONE-acetaminophen (PERCOCET/ROXICET) 5-325 MG tablet Take 1 tablet by mouth every 4 (four) hours as needed for severe pain. 07/03/15   Zadie Rhineonald Wickline, MD   BP 95/68 mmHg  Pulse 65  Temp(Src) 97.8 F (36.6 C) (Oral)  Resp 16  Ht 5\' 5"  (1.651 m)  Wt 100.699 kg  BMI 36.94 kg/m2  SpO2 100%  LMP 11/27/2015 Physical Exam  Constitutional: She is oriented to person, place, and time. She appears well-developed and well-nourished.  HENT:  Head: Normocephalic.  Right Ear: External ear normal.  Left Ear: External ear normal.  Cardiovascular: Normal rate.   Pulmonary/Chest: Effort normal.  Musculoskeletal: She exhibits edema and tenderness.  Neurological: She is alert and oriented to person, place, and time. She has normal reflexes.  Skin: Skin is warm.  Psychiatric: She has a normal mood and affect.  Nursing note and vitals reviewed.   ED Course  Procedures (including critical care time) Labs Review Labs Reviewed - No data to display  Imaging Review No results found. I have personally reviewed and evaluated these images and lab results as part of my medical decision-making.  EKG Interpretation None      MDM    Final diagnoses:  Lymphadenopathy    Pt placed in support hose.  Pt counseled on ways  to reduce swelling,       Elson Areas, PA-C 12/11/15 0915  Derwood Kaplan, MD 12/13/15 [phone]

## 2015-12-11 NOTE — ED Notes (Signed)
Compression hose placed on pt's left lower leg. Pt educated on care and reason for stocking. Pt verbalized understanding. nad noted.

## 2017-01-06 IMAGING — US US ART/VEN ABD/PELV/SCROTUM DOPPLER COMPLETE
1 series · 13 of 25 positions shown · non-contrast
Comparison: CT of the abdomen and pelvis performed 06/27/2015

CLINICAL DATA: Acute onset of right lower quadrant abdominal pain.
Initial encounter.

EXAM:
TRANSABDOMINAL AND TRANSVAGINAL ULTRASOUND OF PELVIS
DOPPLER ULTRASOUND OF OVARIES
TECHNIQUE: Both transabdominal and transvaginal ultrasound examinations of the
pelvis were performed. Transabdominal technique was performed for
global imaging of the pelvis including uterus, ovaries, adnexal
regions, and pelvic cul-de-sac.
It was necessary to proceed with endovaginal exam following the
transabdominal exam to visualize the uterus and ovaries in greater
detail. Color and duplex Doppler ultrasound was utilized to evaluate
blood flow to the ovaries.

[Series 1: us art/ven abd/pelv/scrotum doppler complete · 0.24mm/px · 106 acquisitions, 13 frames shown]
[im 1/106]
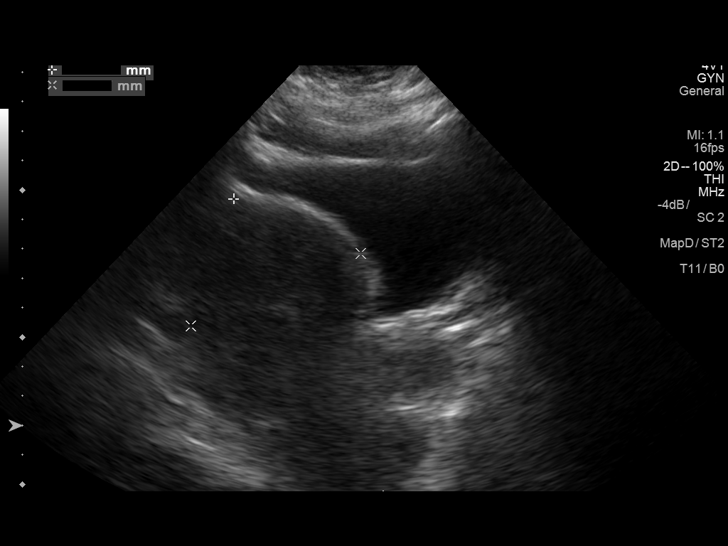
[im 9/106]
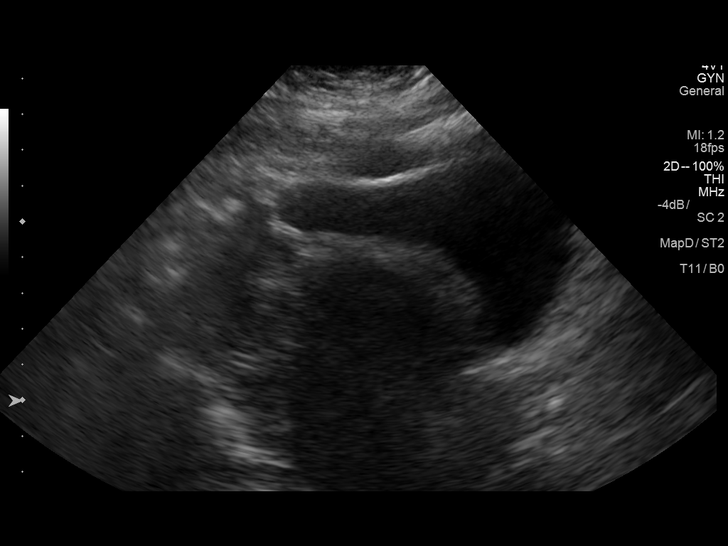
[im 18/106]
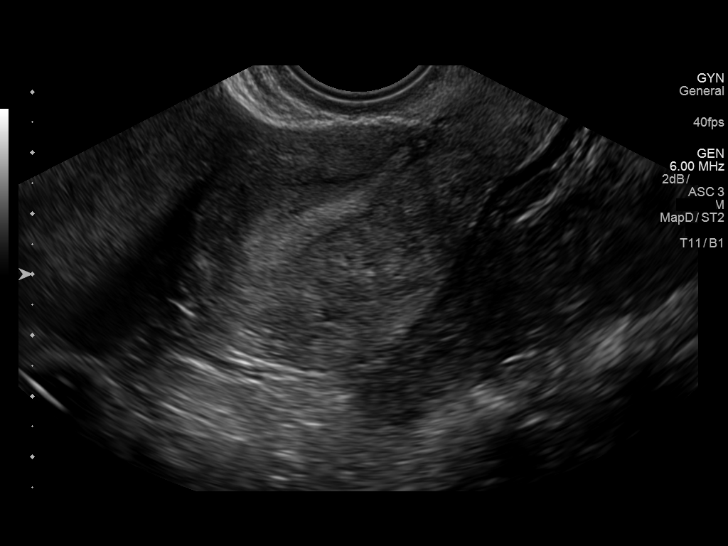
[im 27/106]
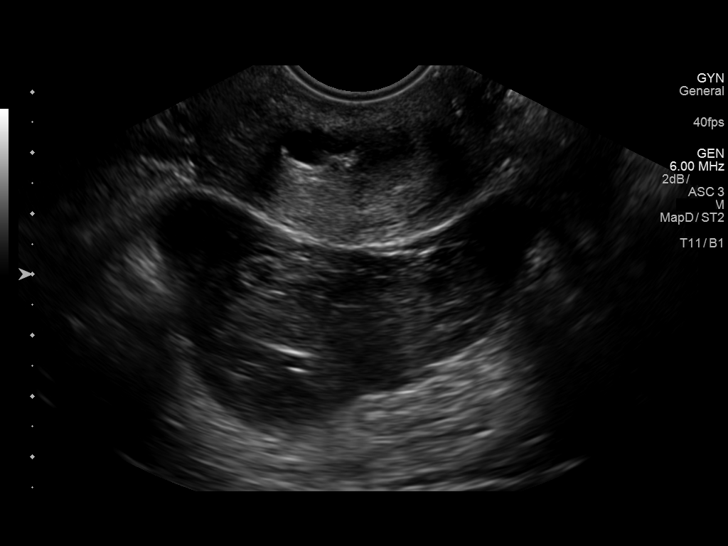
[im 36/106]
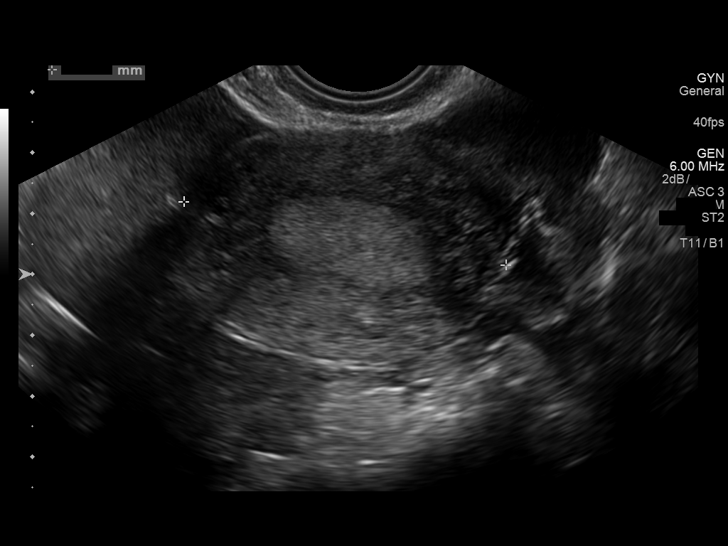
[im 44/106]
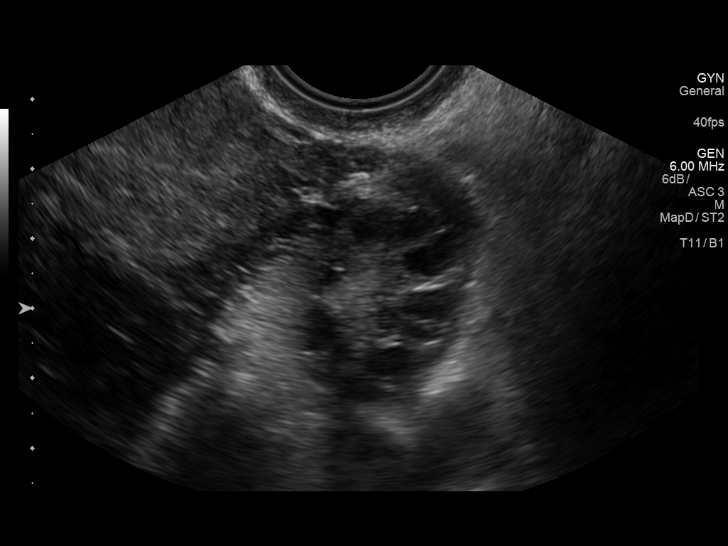
[im 53/106]
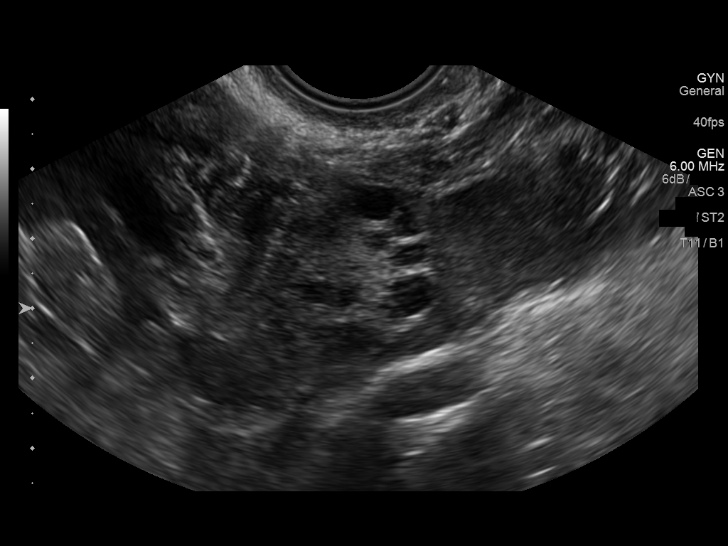
[im 62/106]
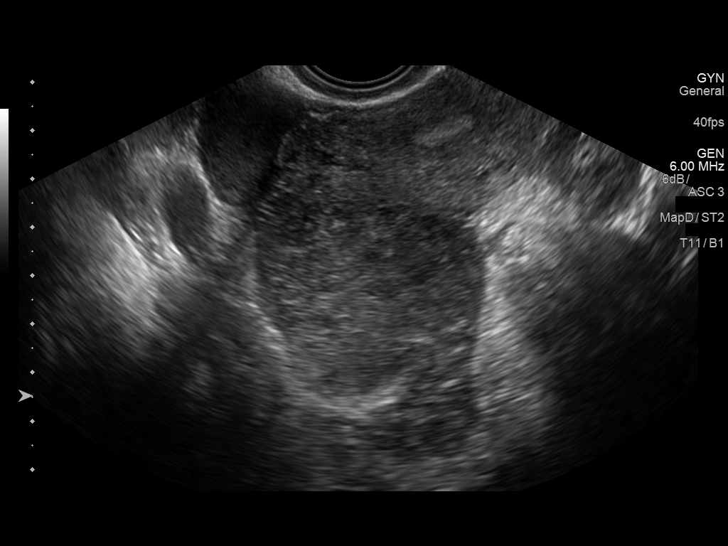
[im 71/106]
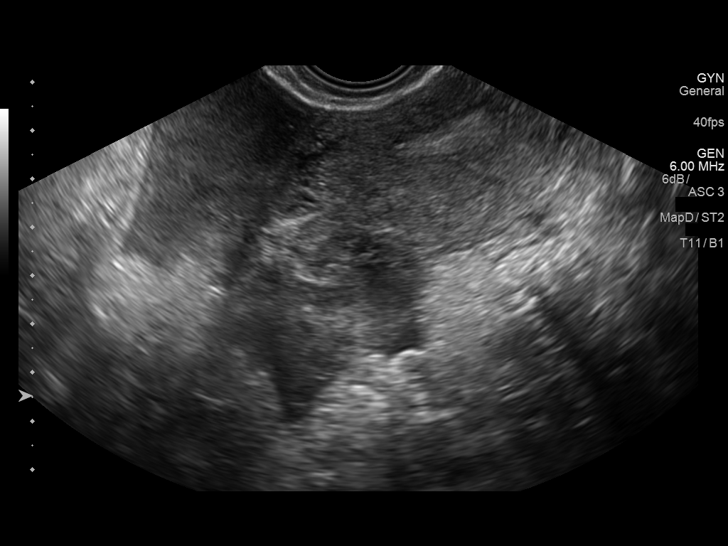
[im 79/106]
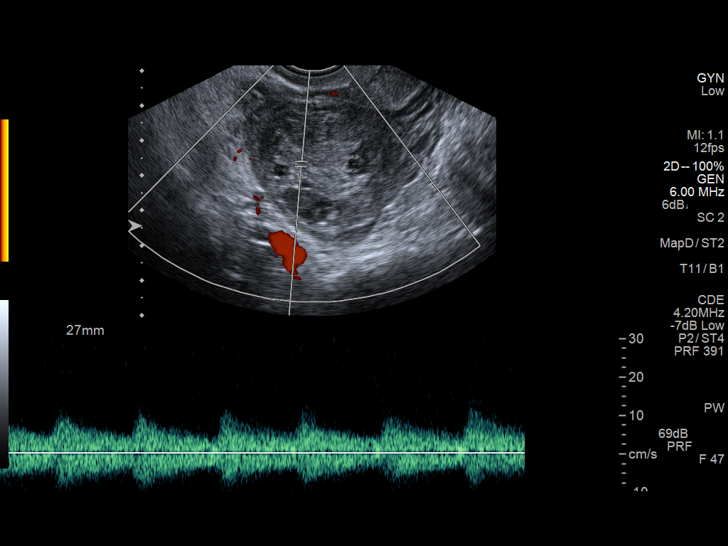
[im 88/106]
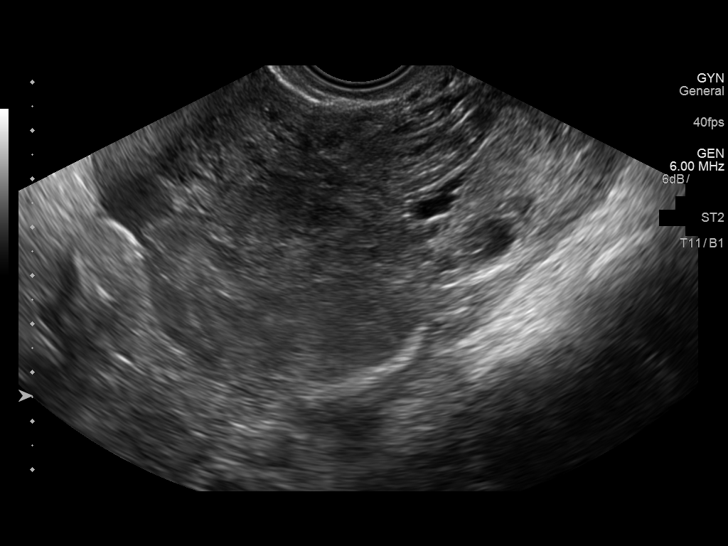
[im 97/106]
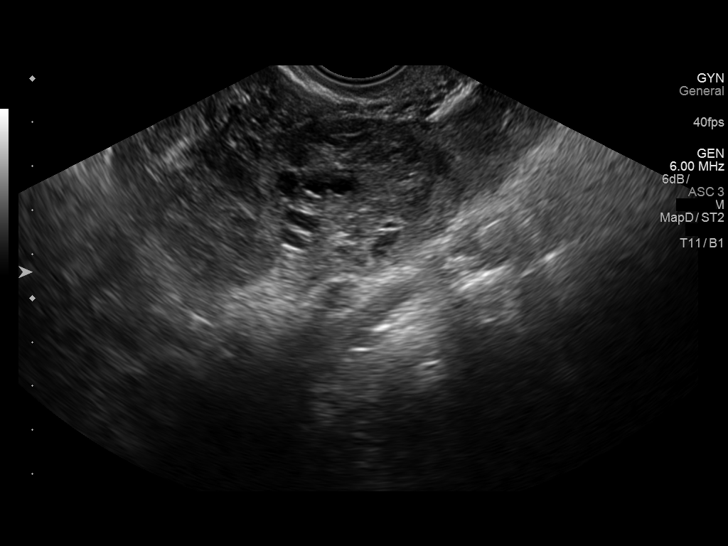
[im 106/106]
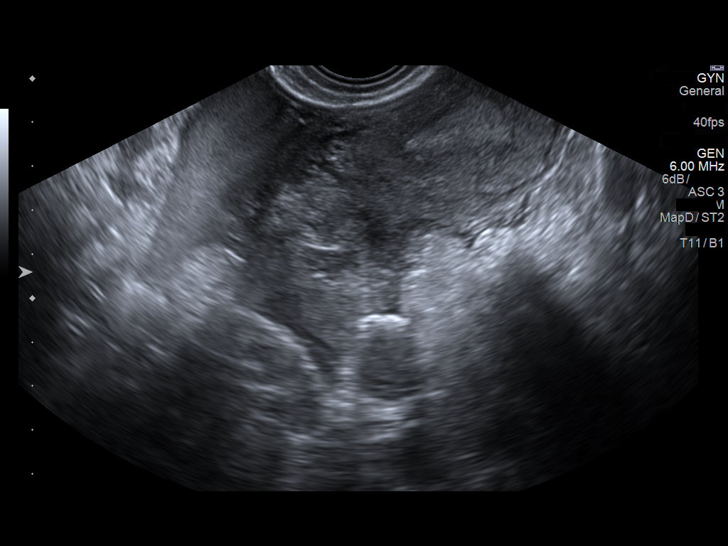

[13 of 25 positions shown; findings below may reference images not displayed]

FINDINGS: Uterus

Measurements: 8.7 x 4.4 x 5.4 cm. No fibroids visualized. Nabothian
cysts are noted at the cervix.

Endometrium

Thickness: 1.0 cm.  No focal abnormality visualized.

Right ovary

Measurements: 4.7 x 4.6 x 4.7 cm. Somewhat asymmetrically prominent.
Surrounding complex fluid is noted, corresponding to the finding on
CT and suspicious for pyosalpinx. A tubo-ovarian abscess cannot be
excluded, but is not well defined.

Left ovary

Measurements: 3.0 x 2.5 x 2.7 cm. Normal appearance/no adnexal mass.

Pulsed Doppler evaluation of both ovaries demonstrates normal
low-resistance arterial and venous waveforms, though the right ovary
is difficult to assess due to surrounding complex fluid.

Other findings

The complex fluid about the pelvis, described above, is difficult to
fully assess.
IMPRESSION: 1. Complex fluid noted tracking about the right ovary corresponds to
the finding on CT and is suspicious for pyosalpinx. A tubo-ovarian
abscess cannot be excluded, but is not well defined. The right ovary
is asymmetrically prominent, without evidence for ovarian torsion.
2. Left ovary and uterus unremarkable in appearance.

## 2017-01-11 IMAGING — US US ART/VEN ABD/PELV/SCROTUM DOPPLER LTD
1 series · 13 of 25 positions shown · non-contrast
Comparison: 06/28/2015

CLINICAL DATA: Fallopian tube portion

EXAM:
TRANSABDOMINAL AND TRANSVAGINAL ULTRASOUND OF PELVIS
DOPPLER ULTRASOUND OF OVARIES
TECHNIQUE: Both transabdominal and transvaginal ultrasound examinations of the
pelvis were performed. Transabdominal technique was performed for
global imaging of the pelvis including uterus, ovaries, adnexal
regions, and pelvic cul-de-sac.
It was necessary to proceed with endovaginal exam following the
transabdominal exam to visualize the endometrium and ovaries. Color
and duplex Doppler ultrasound was utilized to evaluate blood flow to
the ovaries.

[Series 1: us art/ven abd/pelv/scrotum doppler ltd · 0.25mm/px · 13 of 82 slices shown]
[im 1/82]
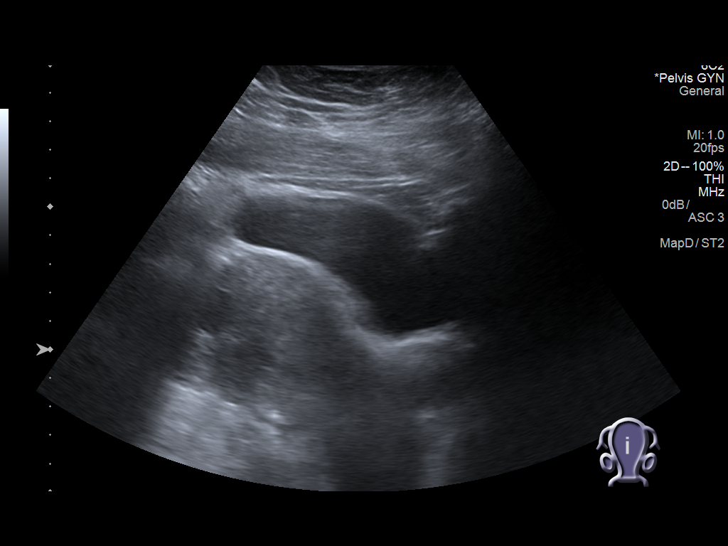
[im 7/82]
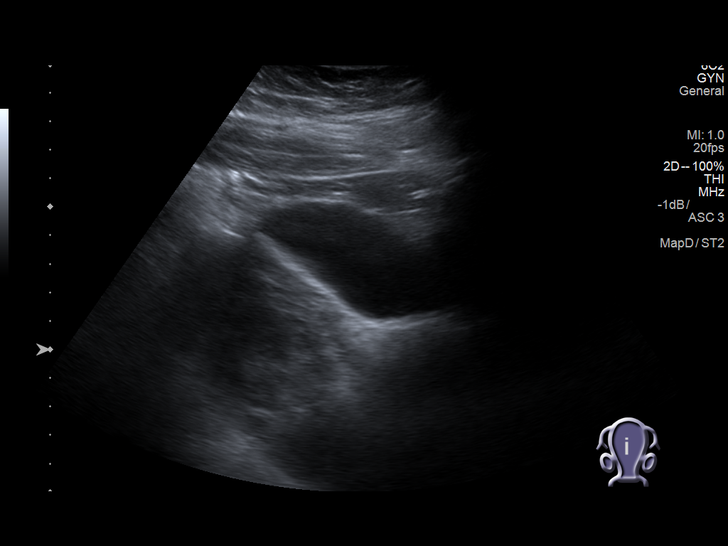
[im 14/82]
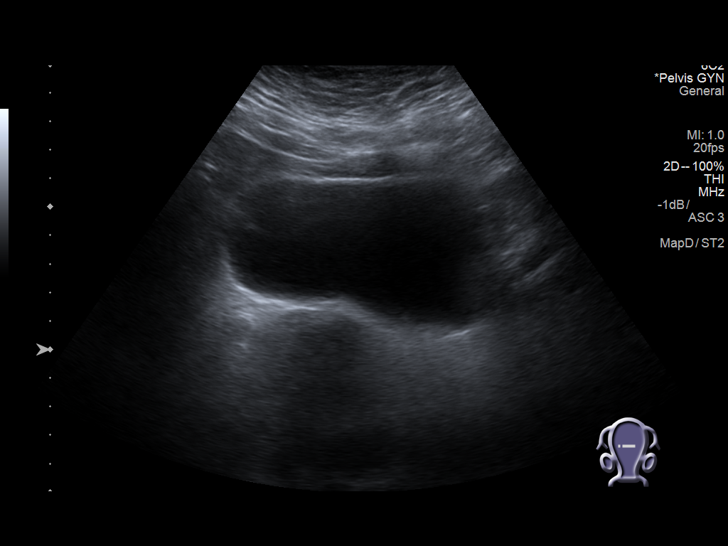
[im 21/82]
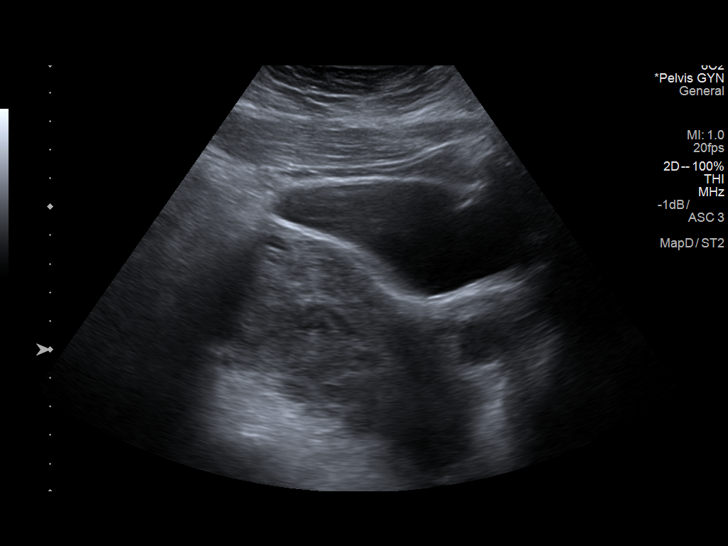
[im 28/82]
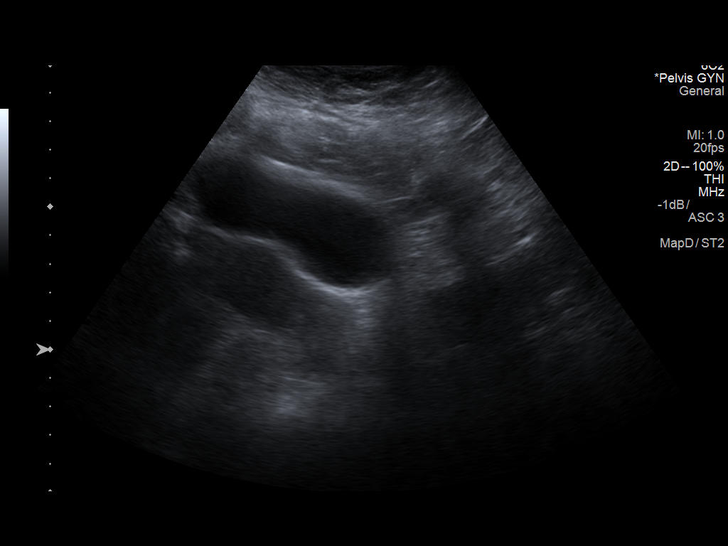
[im 34/82]
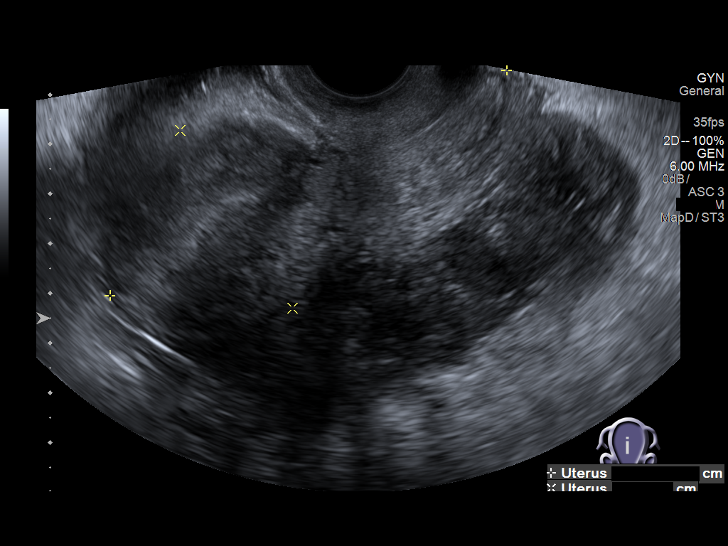
[im 41/82]
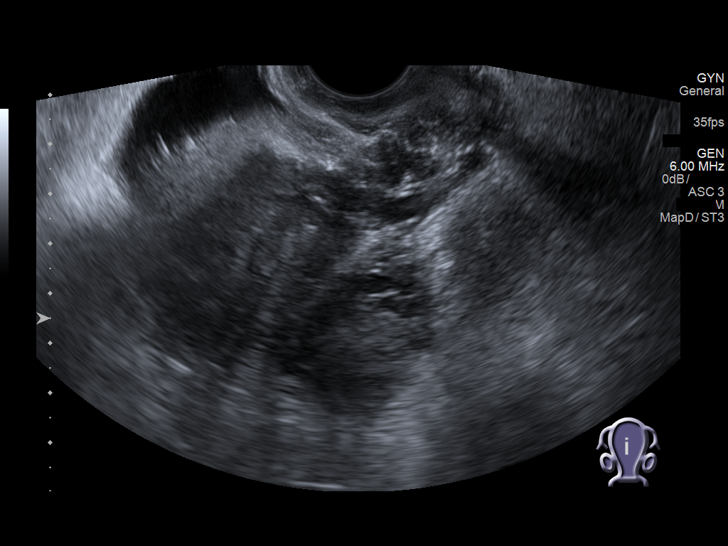
[im 48/82]
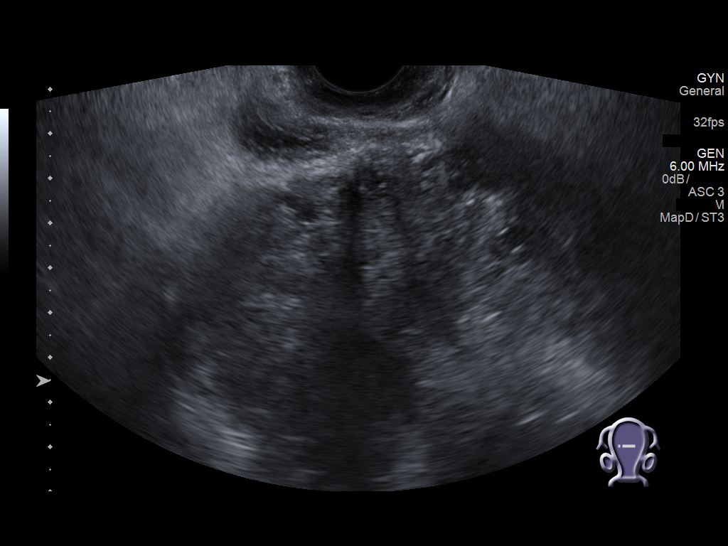
[im 55/82]
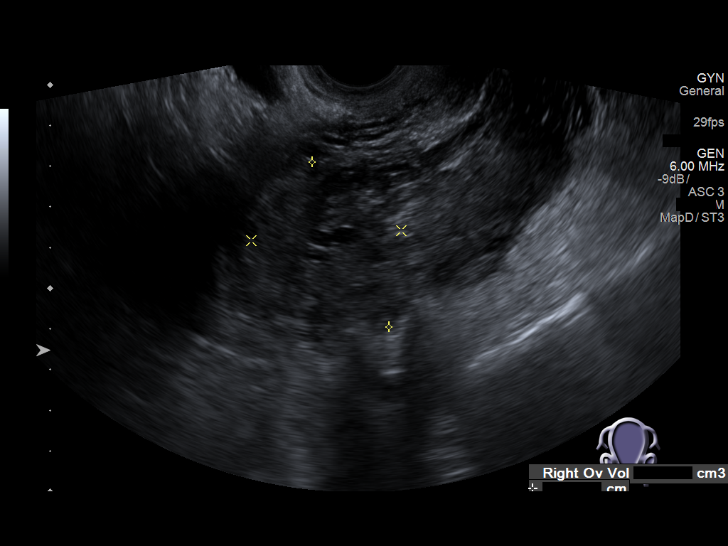
[im 61/82]
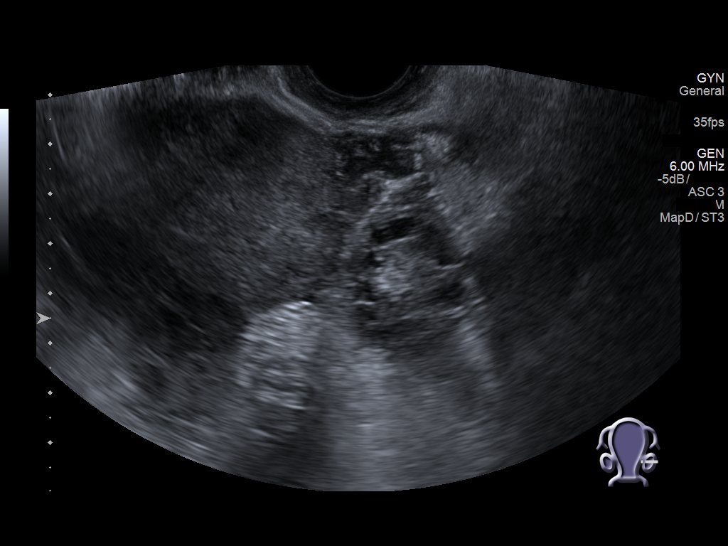
[im 68/82]
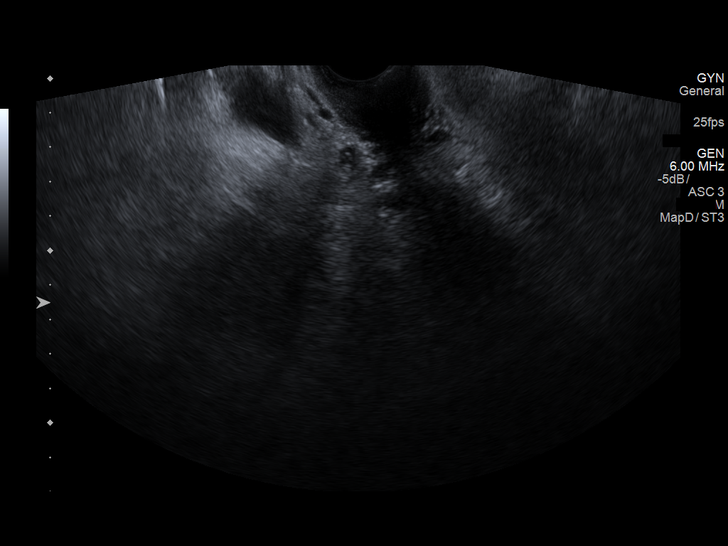
[im 75/82]
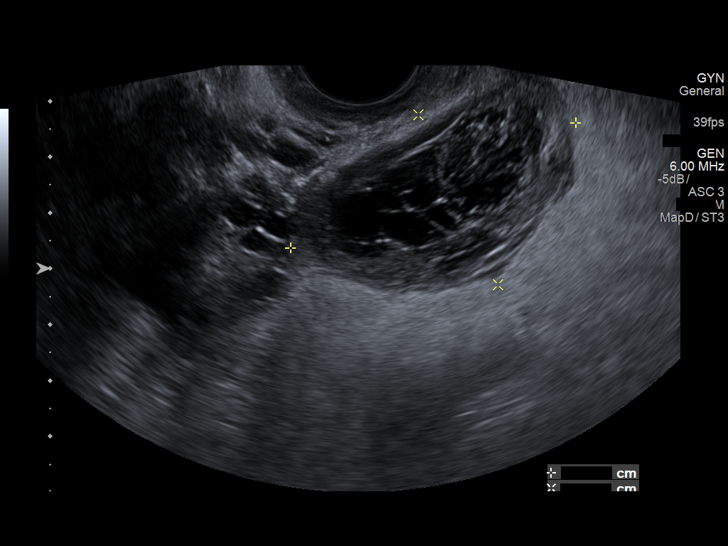
[im 82/82]
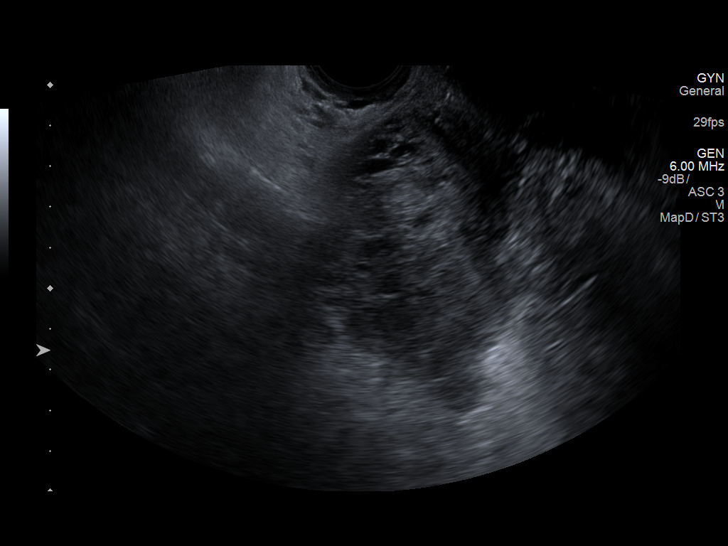

[13 of 25 positions shown; findings below may reference images not displayed]

FINDINGS: Uterus

Measurements: 9.2 x 4.3 x 5.5 cm. 1.8 x 1.3 x 2.4 cm hypoechoic left
uterine mass most consistent with a fibroid.

Endometrium

Thickness: 12 mm.  No focal abnormality visualized.

Right ovary

Measurements: 3.7 x 4.5 x 3.7 cm. Complex ill defined right adnexal
mass is noted abutting the right ovary which may reflect an
exophytic ovarian mass versus paraovarian measuring approximately
3.4 x 3.1 cm.

Left ovary

Measurements: 2.6 x 3.9 x 2.6 cm. 5.6 x 3.4 x 4.1 cm complex left
adnexal mass with multiple internal septations which abuts the left
ovary and may reflect a exophytic mass versus paraovarian mass.

Pulsed Doppler evaluation of both ovaries demonstrates normal
low-resistance arterial and venous waveforms.

Other findings

No abnormal free fluid.
IMPRESSION: 1. Bilateral complex adnexal masses which abut the ovaries which may
reflect exophytic ovarian masses versus parovarian masses.
Differential considerations include neoplasm versus hemorrhagic
cyst/endometriomas versus tubo-ovarian abscess. Recommend further
characterization with MRI of the pelvis given the size of the masses
and relative difficulty in completely imaging the right adnexal
area.

## 2017-04-25 ENCOUNTER — Encounter (HOSPITAL_COMMUNITY): Payer: Self-pay | Admitting: Emergency Medicine

## 2017-04-25 ENCOUNTER — Other Ambulatory Visit: Payer: Self-pay

## 2017-04-25 ENCOUNTER — Emergency Department (HOSPITAL_COMMUNITY): Payer: Medicaid Other

## 2017-04-25 ENCOUNTER — Emergency Department (HOSPITAL_COMMUNITY)
Admission: EM | Admit: 2017-04-25 | Discharge: 2017-04-26 | Disposition: A | Discharge status: Home / Self Care | Payer: Medicaid Other | Attending: Emergency Medicine | Admitting: Emergency Medicine

## 2017-04-25 DIAGNOSIS — O4690 Antepartum hemorrhage, unspecified, unspecified trimester: Secondary | ICD-10-CM

## 2017-04-25 DIAGNOSIS — Z79899 Other long term (current) drug therapy: Secondary | ICD-10-CM | POA: Insufficient documentation

## 2017-04-25 DIAGNOSIS — O10011 Pre-existing essential hypertension complicating pregnancy, first trimester: Secondary | ICD-10-CM | POA: Insufficient documentation

## 2017-04-25 DIAGNOSIS — O9989 Other specified diseases and conditions complicating pregnancy, childbirth and the puerperium: Secondary | ICD-10-CM | POA: Diagnosis present

## 2017-04-25 DIAGNOSIS — O26859 Spotting complicating pregnancy, unspecified trimester: Secondary | ICD-10-CM | POA: Diagnosis not present

## 2017-04-25 DIAGNOSIS — Z87891 Personal history of nicotine dependence: Secondary | ICD-10-CM | POA: Diagnosis not present

## 2017-04-25 DIAGNOSIS — O200 Threatened abortion: Secondary | ICD-10-CM

## 2017-04-25 DIAGNOSIS — Z3A01 Less than 8 weeks gestation of pregnancy: Secondary | ICD-10-CM | POA: Diagnosis not present

## 2017-04-25 DIAGNOSIS — R102 Pelvic and perineal pain: Secondary | ICD-10-CM | POA: Insufficient documentation

## 2017-04-25 HISTORY — DX: Unspecified pre-eclampsia, unspecified trimester: O14.90

## 2017-04-25 LAB — CBC WITH DIFFERENTIAL/PLATELET
BASOS PCT: 0 %
Basophils Absolute: 0 10*3/uL (ref 0.0–0.1)
EOS ABS: 0 10*3/uL (ref 0.0–0.7)
Eosinophils Relative: 0 %
HCT: 36.3 % (ref 36.0–46.0)
HEMOGLOBIN: 11.9 g/dL — AB (ref 12.0–15.0)
Lymphocytes Relative: 54 %
Lymphs Abs: 5 10*3/uL — ABNORMAL HIGH (ref 0.7–4.0)
MCH: 26.9 pg (ref 26.0–34.0)
MCHC: 32.8 g/dL (ref 30.0–36.0)
MCV: 81.9 fL (ref 78.0–100.0)
Monocytes Absolute: 0.8 10*3/uL (ref 0.1–1.0)
Monocytes Relative: 9 %
NEUTROS ABS: 3.4 10*3/uL (ref 1.7–7.7)
NEUTROS PCT: 37 %
Platelets: 284 10*3/uL (ref 150–400)
RBC: 4.43 MIL/uL (ref 3.87–5.11)
RDW: 13.2 % (ref 11.5–15.5)
WBC: 9.3 10*3/uL (ref 4.0–10.5)

## 2017-04-25 LAB — URINALYSIS, ROUTINE W REFLEX MICROSCOPIC
Bacteria, UA: NONE SEEN
Bilirubin Urine: NEGATIVE
Glucose, UA: NEGATIVE mg/dL
Ketones, ur: NEGATIVE mg/dL
LEUKOCYTES UA: NEGATIVE
Nitrite: NEGATIVE
PH: 6 (ref 5.0–8.0)
Protein, ur: NEGATIVE mg/dL
Specific Gravity, Urine: 1.016 (ref 1.005–1.030)

## 2017-04-25 LAB — WET PREP, GENITAL
Clue Cells Wet Prep HPF POC: NONE SEEN
SPERM: NONE SEEN
TRICH WET PREP: NONE SEEN
YEAST WET PREP: NONE SEEN

## 2017-04-25 LAB — ABO/RH: ABO/RH(D): B POS

## 2017-04-25 LAB — HCG, QUANTITATIVE, PREGNANCY: hCG, Beta Chain, Quant, S: 917 m[IU]/mL — ABNORMAL HIGH (ref <5)

## 2017-04-25 MED ORDER — PRENATAL VITAMIN 27-0.8 MG PO TABS: 1.0000 | ORAL_TABLET | Freq: Every morning | ORAL | 0 refills | Status: DC

## 2017-04-25 MED ORDER — ACETAMINOPHEN 325 MG PO TABS
650.0000 mg | ORAL_TABLET | Freq: Once | ORAL | Status: AC
  Administered 2017-04-25: 650 mg via ORAL
  Filled 2017-04-25: qty 2

## 2017-04-25 NOTE — ED Notes (Signed)
Patient transported to Ultrasound 

## 2017-04-25 NOTE — ED Provider Notes (Signed)
[facility] Note   CSN: [id number] Arrival date & time: 04/25/17  1336     History   Chief Complaint Chief Complaint  Patient presents with  . Abdominal Cramping  . Vaginal Bleeding    HPI Emma Scott is a 31 y.o. female.  Developed  lower abdominal cramping and had slight amount of vaginal spotting onset yesterday.  She learned she was pregnant last week.  Last normal menstrual period was February 24, 2017 however she had one day of bleeding the first week of October 2018.  No nausea or vomiting no fever no urinary symptoms.  No other associated symptoms.  Thing makes symptoms better or worse  HPI  Past Medical History:  Diagnosis Date  . Hypertension    when pregnant  . Lymphedema   . Preeclampsia     There are no active problems to display for this patient.   Past Surgical History:  Procedure Laterality Date  . CESAREAN SECTION    . LYMPHADENECTOMY      OB History    No data available     Gravida 5 para 2 with 1 miscarriage, 1 therapeutic abortion, 2 living children  Home Medications    Prior to Admission medications   Medication Sig Start Date End Date Taking? Authorizing Provider  ciprofloxacin (CIPRO) 500 MG tablet Take 1 tablet (500 mg total) by mouth 2 (two) times daily. 06/28/15   Janne NapoleonNeese, Hope M, NP  metroNIDAZOLE (FLAGYL) 500 MG tablet Take 1 tablet (500 mg total) by mouth 2 (two) times daily. 06/28/15   Janne NapoleonNeese, Hope M, NP  ondansetron (ZOFRAN) 4 MG tablet Take 1 tablet (4 mg total) by mouth every 6 (six) hours. 06/28/15   Janne NapoleonNeese, Hope M, NP  oxyCODONE-acetaminophen (PERCOCET/ROXICET) 5-325 MG tablet Take 1 tablet by mouth every 4 (four) hours as needed for severe pain. 07/03/15   Zadie RhineWickline, Donald, MD    Family History No family history on file.  Social History Social History   Tobacco Use  . Smoking status: Former Smoker    Packs/day: 0.25    Types: Cigarettes  . Smokeless tobacco: Never Used    Substance Use Topics  . Alcohol use: Yes    Comment: occasional  . Drug use: No     Allergies   Penicillins and Sulfa antibiotics   Review of Systems Review of Systems  Constitutional: Negative.   HENT: Negative.   Respiratory: Negative.   Cardiovascular: Negative.   Gastrointestinal: Positive for abdominal pain.       Lower abdominal cramping  Genitourinary: Positive for vaginal bleeding.       Spotting  Musculoskeletal: Negative.   Skin: Negative.   Neurological: Negative.   Psychiatric/Behavioral: Negative.   All other systems reviewed and are negative.    Physical Exam Updated Vital Signs BP (!) 106/59   Pulse 86   Temp 98.1 F (36.7 C) (Oral)   Resp 18   Ht 5\' 3"  (1.6 m)   Wt 99.8 kg (220 lb)   SpO2 100%   BMI 38.97 kg/m   Physical Exam  Constitutional: She appears well-developed and well-nourished.  HENT:  Head: Normocephalic and atraumatic.  Eyes: Conjunctivae are normal. Pupils are equal, round, and reactive to light.  Neck: Neck supple. No tracheal deviation present. No thyromegaly present.  Cardiovascular: Normal rate and regular rhythm.  No murmur heard. Pulmonary/Chest: Effort normal and breath sounds normal.  Abdominal: Soft. Bowel sounds are normal. She exhibits no distension. There is  no tenderness.  Obese  Genitourinary:  Genitourinary Comments: Cervical loss closed.  No blood or discharge involved.  No cervical motion tenderness no adnexal masses or tenderness  Musculoskeletal: Normal range of motion. She exhibits no edema or tenderness.  Neurological: She is alert. Coordination normal.  Skin: Skin is warm and dry. No rash noted.  Psychiatric: She has a normal mood and affect.  Nursing note and vitals reviewed.    ED Treatments / Results  Labs (all labs ordered are listed, but only abnormal results are displayed) Labs Reviewed  HCG, QUANTITATIVE, PREGNANCY - Abnormal; Notable for the following components:      Result Value   hCG,  Beta Chain, Quant, S 917 (*)    All other components within normal limits  CBC WITH DIFFERENTIAL/PLATELET - Abnormal; Notable for the following components:   Hemoglobin 11.9 (*)    Lymphs Abs 5.0 (*)    All other components within normal limits  WET PREP, GENITAL  HIV ANTIBODY (ROUTINE TESTING)  URINALYSIS, ROUTINE W REFLEX MICROSCOPIC  ABO/RH  GC/CHLAMYDIA PROBE AMP ([place]) [facility]    EKG  EKG Interpretation None       Radiology No results found.  Procedures Procedures (including critical care time)  Medications Ordered in ED Medications  acetaminophen (TYLENOL) tablet 650 mg (not administered)     Initial Impression / Assessment and Plan / ED Course  I have reviewed the triage vital signs and the nursing notes.  Pertinent labs & imaging results that were available during my care of the patient were reviewed by me and considered in my medical decision making (see chart for details).     11:40 PM resting comfortably.  No distress. I doubt ectopic pregnancy based on presentation.  Threatened abortion more likely.  Plan prescription prenatal vitamins follow-up with OB/GYN keep scheduled appointment May 08, 2017 Final Clinical Impressions(s) / ED Diagnoses  Threatened miscarriage Final diagnoses:  None    ED Discharge Orders    None       Doug SouJacubowitz, Amato Sevillano, MD 04/25/17 2350

## 2017-04-25 NOTE — Discharge Instructions (Signed)
No sex, nothing in vagina until bleeding stops.  If you develop severe pain or lightheadedness or feel faint call 911 and return to the emergency department immediately as this could represent a pregnancy your tubes which is dangerous.  Otherwise keep your scheduled appointment with your OB/GYN doctor on  05/08/17 if you develop worsening cramping or bleeding, call your OB/GYN doctor to be seen sooner or return to the emergency department.  Take the prenatal vitamins as prescribed.. Your blood type is B+.  Please commit your blood type to memory

## 2017-04-25 NOTE — ED Triage Notes (Signed)
Pt states "just found out I was pregnant last week, now having abd cramps, and vag bleeding-- dark blood-" not wearing any pads, states only bleeding when wiping.

## 2017-04-26 LAB — HIV ANTIBODY (ROUTINE TESTING W REFLEX): HIV Screen 4th Generation wRfx: NONREACTIVE

## 2017-04-26 NOTE — ED Notes (Signed)
Pt departed in NAD, refused use of wheelchair.  

## 2017-04-28 LAB — GC/CHLAMYDIA PROBE AMP (~~LOC~~) NOT AT ARMC
Chlamydia: NEGATIVE
NEISSERIA GONORRHEA: NEGATIVE

## 2017-05-08 DIAGNOSIS — O34219 Maternal care for unspecified type scar from previous cesarean delivery: Secondary | ICD-10-CM | POA: Insufficient documentation

## 2017-06-03 HISTORY — PX: TUBAL LIGATION: SHX77

## 2017-08-15 DIAGNOSIS — Z3689 Encounter for other specified antenatal screening: Secondary | ICD-10-CM | POA: Insufficient documentation

## 2018-11-04 IMAGING — US US OB TRANSVAGINAL
1 series · 13 of 28 positions shown · non-contrast
Comparison: None.

CLINICAL DATA: Vaginal bleeding and cramping for 6 days. Estimated
gestational age by LMP is 4 weeks 3 days. Quantitative beta HCG is
917

EXAM:
OBSTETRIC <14 WK US AND TRANSVAGINAL OB US
TECHNIQUE: Both transabdominal and transvaginal ultrasound examinations were
performed for complete evaluation of the gestation as well as the
maternal uterus, adnexal regions, and pelvic cul-de-sac.
Transvaginal technique was performed to assess early pregnancy.

[Series 1: us ob transvaginal · 0.23mm/px · 70 acquisitions, 13 frames shown]
[im 3/70]
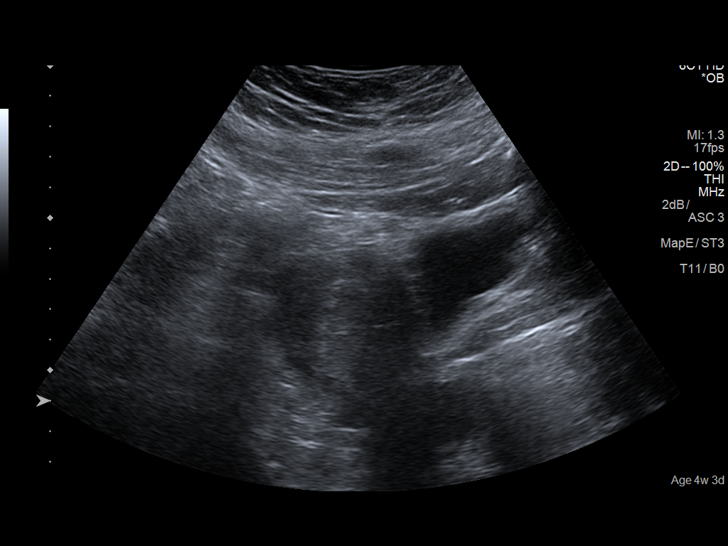
[im 8/70]
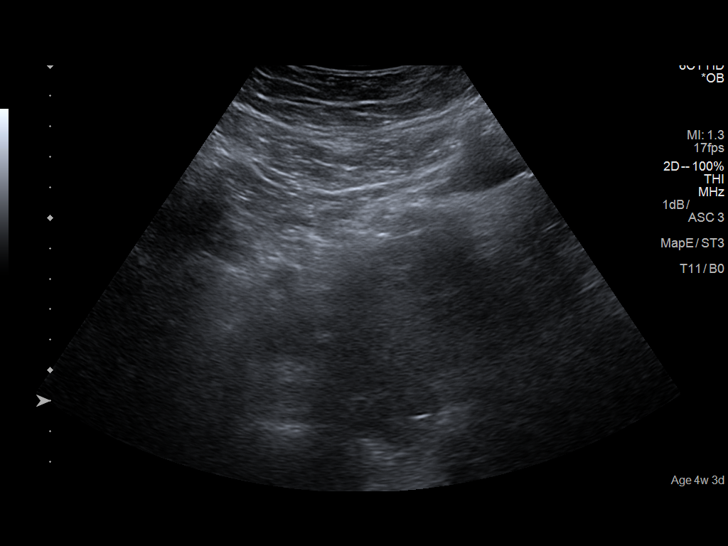
[im 13/70]
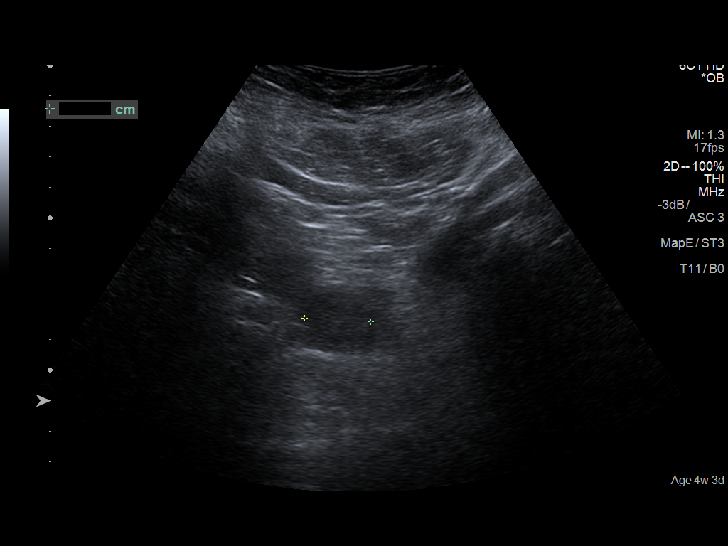
[im 18/70]
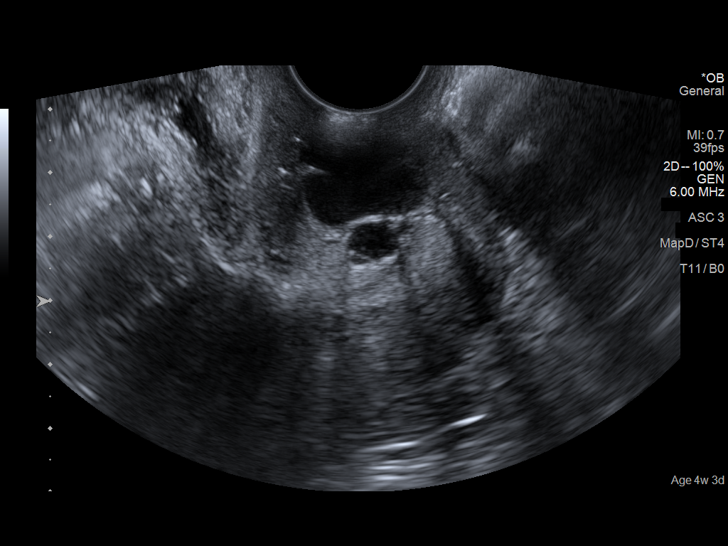
[im 24/70]
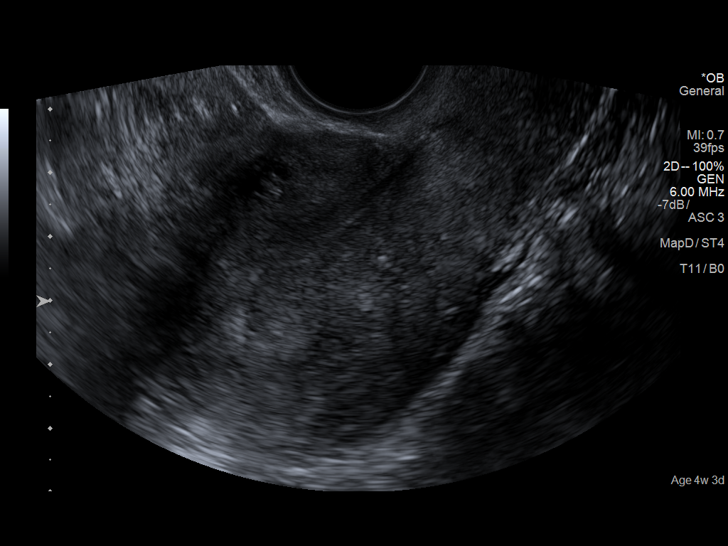
[im 29/70]
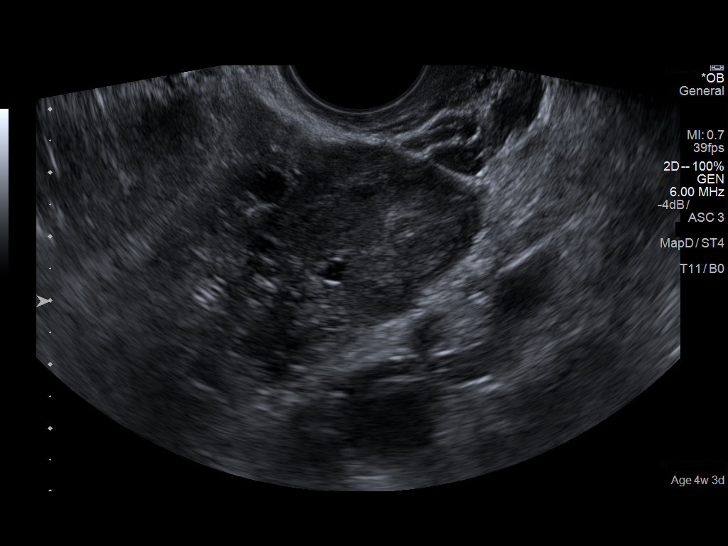
[im 36/70]
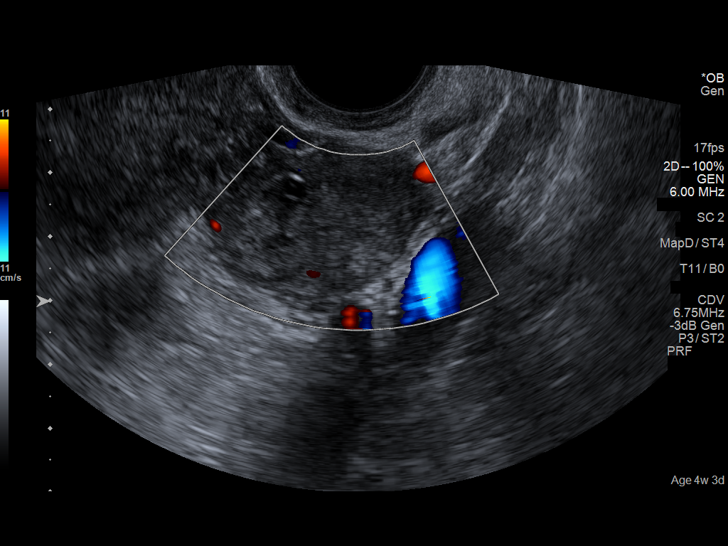
[im 41/70]
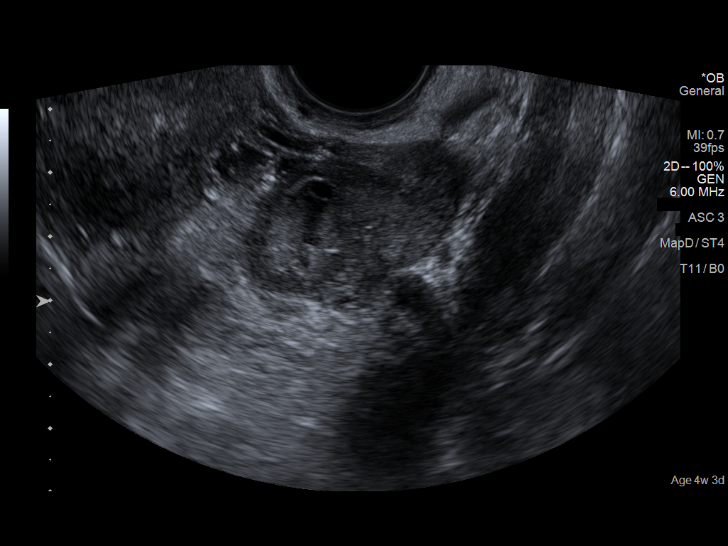
[im 47/70]
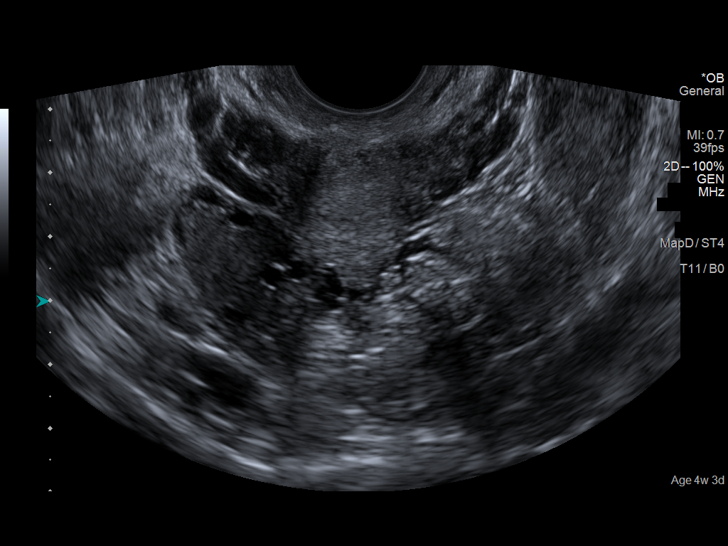
[im 52/70]
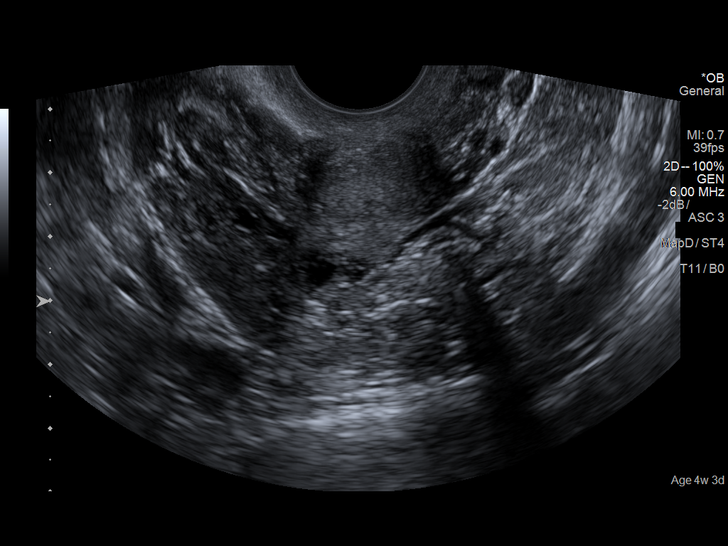
[im 57/70]
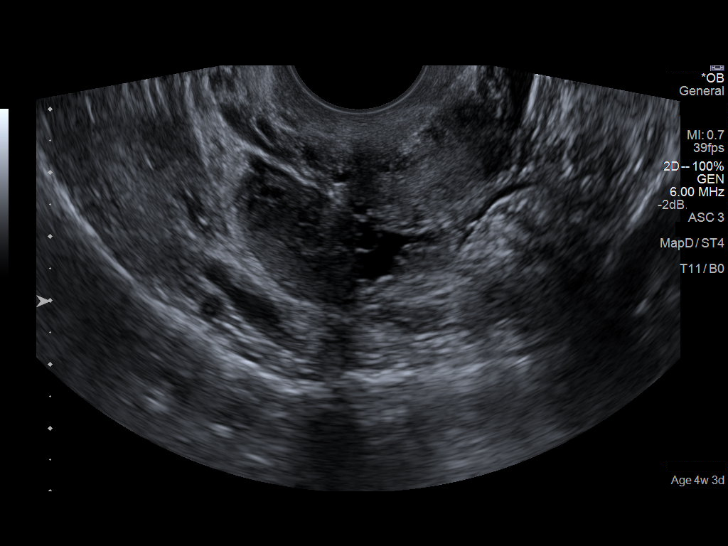
[im 62/70]
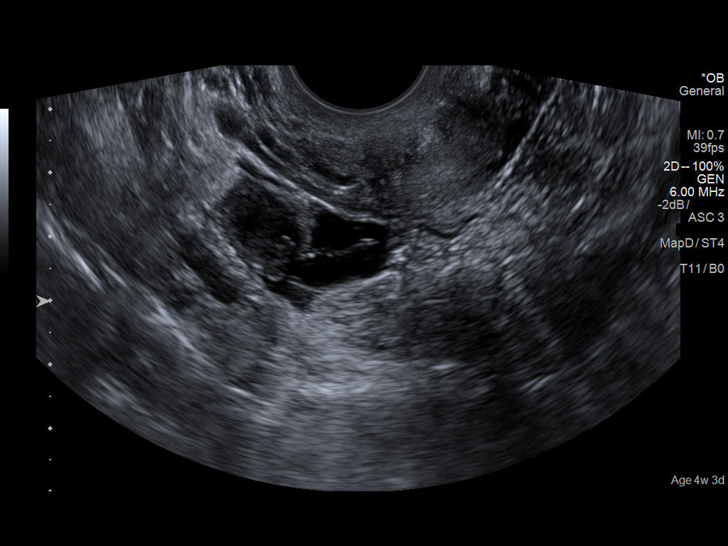
[im 67/70]
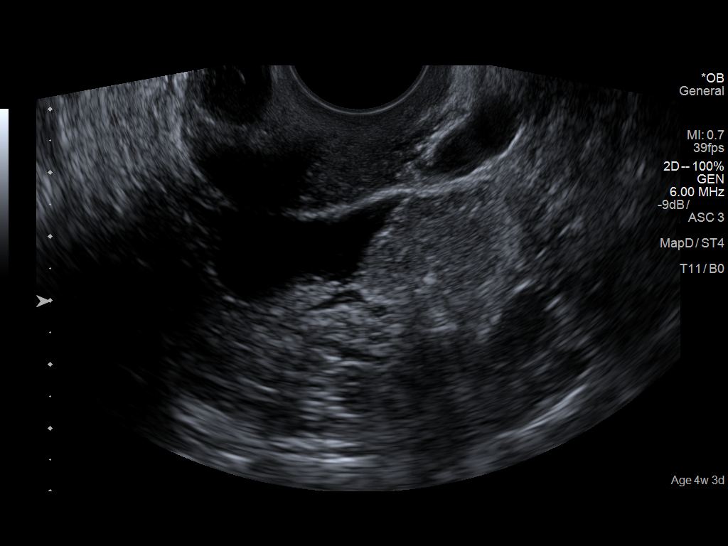

[13 of 28 positions shown; findings below may reference images not displayed]

FINDINGS: Intrauterine gestational sac: Tiny cystic structure demonstrated in
the endometrium may represent a very early intrauterine gestational
sac.

Yolk sac:  Not Visualized.

Embryo:  Not Visualized.

Cardiac Activity: Not Visualized.

MSD: 4  mm   5 w   1  d

Subchorionic hemorrhage:  None visualized.

Maternal uterus/adnexae: Uterus is anteverted. No myometrial mass
lesions identified. Hemorrhagic nabothian cysts in the cervix. Both
ovaries are visualized and contain benign-appearing cystic changes,
likely follicular. Probable corpus luteal cyst on the left. Small
amount of free fluid in the pelvis.
IMPRESSION: Possible early intrauterine gestational sac, but no yolk sac, fetal
pole, or cardiac activity yet visualized. Recommend follow-up
quantitative B-HCG levels and follow-up US in 14 days to assess
viability. This recommendation follows SRU consensus guidelines:
Diagnostic Criteria for Nonviable Pregnancy Early in the First
Trimester. N Engl J Med 9597; [DATE].

## 2019-06-08 ENCOUNTER — Ambulatory Visit: Payer: Medicaid Other | Attending: Internal Medicine

## 2021-07-23 DIAGNOSIS — R509 Fever, unspecified: Secondary | ICD-10-CM | POA: Diagnosis not present

## 2021-07-23 DIAGNOSIS — R519 Headache, unspecified: Secondary | ICD-10-CM | POA: Diagnosis not present

## 2021-07-23 DIAGNOSIS — U071 COVID-19: Secondary | ICD-10-CM | POA: Diagnosis not present

## 2021-07-23 DIAGNOSIS — G43909 Migraine, unspecified, not intractable, without status migrainosus: Secondary | ICD-10-CM | POA: Diagnosis not present

## 2021-07-23 DIAGNOSIS — Z6841 Body Mass Index (BMI) 40.0 and over, adult: Secondary | ICD-10-CM | POA: Diagnosis not present

## 2021-07-23 DIAGNOSIS — Z880 Allergy status to penicillin: Secondary | ICD-10-CM | POA: Diagnosis not present

## 2021-07-23 DIAGNOSIS — Z882 Allergy status to sulfonamides status: Secondary | ICD-10-CM | POA: Diagnosis not present

## 2021-07-23 DIAGNOSIS — R059 Cough, unspecified: Secondary | ICD-10-CM | POA: Diagnosis not present

## 2021-07-23 DIAGNOSIS — I10 Essential (primary) hypertension: Secondary | ICD-10-CM | POA: Diagnosis not present

## 2021-11-06 DIAGNOSIS — R739 Hyperglycemia, unspecified: Secondary | ICD-10-CM | POA: Diagnosis not present

## 2021-11-06 DIAGNOSIS — Z6841 Body Mass Index (BMI) 40.0 and over, adult: Secondary | ICD-10-CM | POA: Diagnosis not present

## 2021-11-06 DIAGNOSIS — R69 Illness, unspecified: Secondary | ICD-10-CM | POA: Diagnosis not present

## 2021-12-13 DIAGNOSIS — G8911 Acute pain due to trauma: Secondary | ICD-10-CM | POA: Diagnosis not present

## 2021-12-13 DIAGNOSIS — I10 Essential (primary) hypertension: Secondary | ICD-10-CM | POA: Diagnosis not present

## 2021-12-13 DIAGNOSIS — W1839XA Other fall on same level, initial encounter: Secondary | ICD-10-CM | POA: Diagnosis not present

## 2021-12-13 DIAGNOSIS — Z882 Allergy status to sulfonamides status: Secondary | ICD-10-CM | POA: Diagnosis not present

## 2021-12-13 DIAGNOSIS — W1789XA Other fall from one level to another, initial encounter: Secondary | ICD-10-CM | POA: Diagnosis not present

## 2021-12-13 DIAGNOSIS — M545 Low back pain, unspecified: Secondary | ICD-10-CM | POA: Diagnosis not present

## 2021-12-13 DIAGNOSIS — Z880 Allergy status to penicillin: Secondary | ICD-10-CM | POA: Diagnosis not present

## 2021-12-31 DIAGNOSIS — M5459 Other low back pain: Secondary | ICD-10-CM | POA: Diagnosis not present

## 2021-12-31 DIAGNOSIS — Z6841 Body Mass Index (BMI) 40.0 and over, adult: Secondary | ICD-10-CM | POA: Diagnosis not present

## 2022-01-23 DIAGNOSIS — Z6841 Body Mass Index (BMI) 40.0 and over, adult: Secondary | ICD-10-CM | POA: Diagnosis not present

## 2022-01-23 DIAGNOSIS — Z0000 Encounter for general adult medical examination without abnormal findings: Secondary | ICD-10-CM | POA: Diagnosis not present

## 2022-02-05 ENCOUNTER — Ambulatory Visit (INDEPENDENT_AMBULATORY_CARE_PROVIDER_SITE_OTHER): Payer: 59 | Admitting: Adult Health

## 2022-02-05 ENCOUNTER — Encounter: Payer: Self-pay | Admitting: Adult Health

## 2022-02-05 ENCOUNTER — Other Ambulatory Visit (HOSPITAL_COMMUNITY)
Admission: RE | Admit: 2022-02-05 | Discharge: 2022-02-05 | Disposition: A | Discharge status: Home / Self Care | Payer: 59 | Source: Ambulatory Visit | Attending: Adult Health | Admitting: Adult Health

## 2022-02-05 VITALS — BP 121/81 | HR 79 | Ht 63.0 in | Wt 264.5 lb

## 2022-02-05 DIAGNOSIS — N946 Dysmenorrhea, unspecified: Secondary | ICD-10-CM | POA: Insufficient documentation

## 2022-02-05 DIAGNOSIS — Z01419 Encounter for gynecological examination (general) (routine) without abnormal findings: Secondary | ICD-10-CM | POA: Insufficient documentation

## 2022-02-05 DIAGNOSIS — Z803 Family history of malignant neoplasm of breast: Secondary | ICD-10-CM

## 2022-02-05 DIAGNOSIS — N920 Excessive and frequent menstruation with regular cycle: Secondary | ICD-10-CM | POA: Insufficient documentation

## 2022-02-05 DIAGNOSIS — N941 Unspecified dyspareunia: Secondary | ICD-10-CM | POA: Insufficient documentation

## 2022-02-05 NOTE — Progress Notes (Signed)
Patient ID: Emma Scott, female   DOB: [birth year], 36 y.o.   MRN: [id number] History of Present Illness: Emma Scott is a 36 year old black female,married, [id number], in for a well woman gyn exam and pap. She is new to this practice. 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  PCP is Dr Margo Common   Current Medications, Allergies, Past Medical History, Past Surgical History, Family History and Social History were reviewed in [employer] electronic medical record.     Review of Systems: Patient denies any headaches, hearing loss, fatigue, blurred vision, shortness of breath, chest pain, abdominal pain, problems with bowel movements, urination. No joint pain or mood swings.  She has pain with sex, going in and with thrust Periods heavier since tubal, last a 6-7 days with 3 heavy days, may change tampon every 4 hours and has clots and cramps.   Physical Exam:BP 121/81 (BP Location: Left Arm, Patient Position: Sitting, Cuff Size: Large)   Pulse 79   Ht 5\' 3"  (1.6 m)   Wt 264 lb 8 oz (120 kg)   LMP 01/31/2022 (Approximate)   BMI 46.85 kg/m   General:  Well developed, well nourished, no acute distress Skin:  Warm and dry Neck:  Midline trachea, normal thyroid, good ROM, no lymphadenopathy Lungs; Clear to auscultation bilaterally Breast:  No dominant palpable mass, retraction, or nipple discharge Cardiovascular: Regular rate and rhythm Abdomen:  Soft, non tender, no hepatosplenomegaly Pelvic:  External genitalia is normal in appearance, no lesions.  The vagina is normal in appearance. Urethra has no lesions or masses. The cervix is bulbous.Pap with HR HPV genotyping and GC/CHL performed.  Uterus is felt to be normal size, shape, and contour, and mildly tender.  No adnexal masses,+ tenderness noted.Bladder is non tender, no masses felt. Rectal:Deferred  Extremities/musculoskeletal:  No swelling or varicosities noted, no clubbing or cyanosis Psych:  No mood changes, alert and cooperative,seems  happy AA is 1 Fall risk is moderated    02/05/2022    8:59 AM  Depression screen PHQ 2/9  Decreased Interest 2  Down, Depressed, Hopeless 3  PHQ - 2 Score 5  Altered sleeping 3  Tired, decreased energy 2  Change in appetite 3  Feeling bad or failure about yourself  3  Trouble concentrating 2  Moving slowly or fidgety/restless 0  Suicidal thoughts 0  PHQ-9 Score 18   She is on meds from PCP     02/05/2022    8:59 AM  GAD 7 : Generalized Anxiety Score  Nervous, Anxious, on Edge 3  Control/stop worrying 3  Worry too much - different things 3  Trouble relaxing 3  Restless 3  Easily annoyed or irritable 3  Afraid - awful might happen 3  Total GAD 7 Score 21    Upstream - 02/05/22 0844       Pregnancy Intention Screening   Does the patient want to become pregnant in the next year? No    Does the patient's partner want to become pregnant in the next year? No    Would the patient like to discuss contraceptive options today? No      Contraception Wrap Up   Current Method Female Sterilization    End Method Female Sterilization              Examination chaperoned by 04/07/22 LPN   Impression and Plan: 1. Encounter for gynecological examination with Papanicolaou smear of cervix Pap sent Pap in 3 years if normal Physical  in 1 year Labs with PCP - Cytology - PAP( [place])  2. Dyspareunia, female Has pian with penetration and thrust Will Get [place]  - US PELVIC COMPLETE WITH TRANSVAGINAL; Future  3. Menorrhagia with regular cycle Periods last about 6-7 days  Heavy 3 days, has more clots and may change tampon every 4 hours  Pelvic US scheduled for 02/07/22 at 2:30 pm at [facility] to assess uterus and ovaries - US PELVIC COMPLETE WITH TRANSVAGINAL; Future Return in 2 weeks to discuss options  4. Dysmenorrhea Has cramps with periods now Will get [place]  - US PELVIC COMPLETE WITH TRANSVAGINAL; Future  5. Family history of breast cancer in mother Will start  mammograms at 35 Discussed Empower test for hereditary cancers, she will think about it

## 2022-02-07 ENCOUNTER — Ambulatory Visit (HOSPITAL_COMMUNITY)
Admission: RE | Admit: 2022-02-07 | Discharge: 2022-02-07 | Disposition: A | Discharge status: Home / Self Care | Payer: 59 | Source: Ambulatory Visit | Attending: Adult Health | Admitting: Adult Health

## 2022-02-07 DIAGNOSIS — N946 Dysmenorrhea, unspecified: Secondary | ICD-10-CM | POA: Diagnosis not present

## 2022-02-07 DIAGNOSIS — N920 Excessive and frequent menstruation with regular cycle: Secondary | ICD-10-CM | POA: Insufficient documentation

## 2022-02-07 DIAGNOSIS — N941 Unspecified dyspareunia: Secondary | ICD-10-CM | POA: Insufficient documentation

## 2022-02-07 LAB — CYTOLOGY - PAP
Chlamydia: NEGATIVE
Comment: NEGATIVE
Comment: NEGATIVE
Comment: NORMAL
Diagnosis: NEGATIVE
High risk HPV: NEGATIVE
Neisseria Gonorrhea: NEGATIVE

## 2022-02-19 ENCOUNTER — Ambulatory Visit: Payer: 59 | Admitting: Adult Health

## 2022-02-21 DIAGNOSIS — Z880 Allergy status to penicillin: Secondary | ICD-10-CM | POA: Diagnosis not present

## 2022-02-21 DIAGNOSIS — Z8249 Family history of ischemic heart disease and other diseases of the circulatory system: Secondary | ICD-10-CM | POA: Diagnosis not present

## 2022-02-21 DIAGNOSIS — Z833 Family history of diabetes mellitus: Secondary | ICD-10-CM | POA: Diagnosis not present

## 2022-02-21 DIAGNOSIS — Z6841 Body Mass Index (BMI) 40.0 and over, adult: Secondary | ICD-10-CM | POA: Diagnosis not present

## 2022-02-21 DIAGNOSIS — Z803 Family history of malignant neoplasm of breast: Secondary | ICD-10-CM | POA: Diagnosis not present

## 2022-02-21 DIAGNOSIS — R69 Illness, unspecified: Secondary | ICD-10-CM | POA: Diagnosis not present

## 2022-02-21 DIAGNOSIS — I10 Essential (primary) hypertension: Secondary | ICD-10-CM | POA: Diagnosis not present

## 2022-02-25 DIAGNOSIS — Z20822 Contact with and (suspected) exposure to covid-19: Secondary | ICD-10-CM | POA: Diagnosis not present

## 2022-05-08 DIAGNOSIS — M5459 Other low back pain: Secondary | ICD-10-CM | POA: Diagnosis not present

## 2022-05-08 DIAGNOSIS — I10 Essential (primary) hypertension: Secondary | ICD-10-CM | POA: Diagnosis not present

## 2022-05-08 DIAGNOSIS — Z6841 Body Mass Index (BMI) 40.0 and over, adult: Secondary | ICD-10-CM | POA: Diagnosis not present

## 2022-05-08 DIAGNOSIS — R69 Illness, unspecified: Secondary | ICD-10-CM | POA: Diagnosis not present

## 2022-05-08 DIAGNOSIS — G4739 Other sleep apnea: Secondary | ICD-10-CM | POA: Diagnosis not present

## 2022-05-08 DIAGNOSIS — R739 Hyperglycemia, unspecified: Secondary | ICD-10-CM | POA: Diagnosis not present

## 2022-05-08 DIAGNOSIS — M1710 Unilateral primary osteoarthritis, unspecified knee: Secondary | ICD-10-CM | POA: Diagnosis not present

## 2022-06-25 DIAGNOSIS — Z882 Allergy status to sulfonamides status: Secondary | ICD-10-CM | POA: Diagnosis not present

## 2022-06-25 DIAGNOSIS — Z1152 Encounter for screening for COVID-19: Secondary | ICD-10-CM | POA: Diagnosis not present

## 2022-06-25 DIAGNOSIS — R059 Cough, unspecified: Secondary | ICD-10-CM | POA: Diagnosis not present

## 2022-06-25 DIAGNOSIS — J069 Acute upper respiratory infection, unspecified: Secondary | ICD-10-CM | POA: Diagnosis not present

## 2022-06-25 DIAGNOSIS — R509 Fever, unspecified: Secondary | ICD-10-CM | POA: Diagnosis not present

## 2022-06-25 DIAGNOSIS — I10 Essential (primary) hypertension: Secondary | ICD-10-CM | POA: Diagnosis not present

## 2022-06-25 DIAGNOSIS — Z880 Allergy status to penicillin: Secondary | ICD-10-CM | POA: Diagnosis not present

## 2022-06-25 DIAGNOSIS — Z79899 Other long term (current) drug therapy: Secondary | ICD-10-CM | POA: Diagnosis not present

## 2022-06-25 DIAGNOSIS — M791 Myalgia, unspecified site: Secondary | ICD-10-CM | POA: Diagnosis not present

## 2022-06-25 DIAGNOSIS — Z20822 Contact with and (suspected) exposure to covid-19: Secondary | ICD-10-CM | POA: Diagnosis not present

## 2022-06-26 DIAGNOSIS — R509 Fever, unspecified: Secondary | ICD-10-CM | POA: Diagnosis not present

## 2022-06-26 DIAGNOSIS — Z20822 Contact with and (suspected) exposure to covid-19: Secondary | ICD-10-CM | POA: Diagnosis not present

## 2022-06-26 DIAGNOSIS — J111 Influenza due to unidentified influenza virus with other respiratory manifestations: Secondary | ICD-10-CM | POA: Diagnosis not present

## 2022-08-07 DIAGNOSIS — Z6841 Body Mass Index (BMI) 40.0 and over, adult: Secondary | ICD-10-CM | POA: Diagnosis not present

## 2022-08-07 DIAGNOSIS — I10 Essential (primary) hypertension: Secondary | ICD-10-CM | POA: Diagnosis not present

## 2022-08-07 DIAGNOSIS — M5459 Other low back pain: Secondary | ICD-10-CM | POA: Diagnosis not present

## 2022-08-07 DIAGNOSIS — F410 Panic disorder [episodic paroxysmal anxiety] without agoraphobia: Secondary | ICD-10-CM | POA: Diagnosis not present

## 2022-08-07 DIAGNOSIS — G4739 Other sleep apnea: Secondary | ICD-10-CM | POA: Diagnosis not present

## 2022-08-07 DIAGNOSIS — M1710 Unilateral primary osteoarthritis, unspecified knee: Secondary | ICD-10-CM | POA: Diagnosis not present

## 2022-08-07 DIAGNOSIS — R739 Hyperglycemia, unspecified: Secondary | ICD-10-CM | POA: Diagnosis not present

## 2022-08-26 ENCOUNTER — Ambulatory Visit: Payer: 59 | Admitting: Adult Health

## 2022-09-09 DIAGNOSIS — Z20822 Contact with and (suspected) exposure to covid-19: Secondary | ICD-10-CM | POA: Diagnosis not present

## 2022-09-09 DIAGNOSIS — J111 Influenza due to unidentified influenza virus with other respiratory manifestations: Secondary | ICD-10-CM | POA: Diagnosis not present

## 2022-09-09 DIAGNOSIS — R059 Cough, unspecified: Secondary | ICD-10-CM | POA: Diagnosis not present

## 2022-09-19 DIAGNOSIS — G43909 Migraine, unspecified, not intractable, without status migrainosus: Secondary | ICD-10-CM | POA: Diagnosis not present

## 2022-09-19 DIAGNOSIS — I10 Essential (primary) hypertension: Secondary | ICD-10-CM | POA: Diagnosis not present

## 2022-09-19 DIAGNOSIS — L02411 Cutaneous abscess of right axilla: Secondary | ICD-10-CM | POA: Diagnosis not present

## 2022-09-19 DIAGNOSIS — Z882 Allergy status to sulfonamides status: Secondary | ICD-10-CM | POA: Diagnosis not present

## 2022-09-19 DIAGNOSIS — Z880 Allergy status to penicillin: Secondary | ICD-10-CM | POA: Diagnosis not present

## 2022-09-19 DIAGNOSIS — Z79899 Other long term (current) drug therapy: Secondary | ICD-10-CM | POA: Diagnosis not present

## 2022-10-22 DIAGNOSIS — R1032 Left lower quadrant pain: Secondary | ICD-10-CM | POA: Diagnosis not present

## 2022-10-22 DIAGNOSIS — I10 Essential (primary) hypertension: Secondary | ICD-10-CM | POA: Diagnosis not present

## 2022-10-22 DIAGNOSIS — R112 Nausea with vomiting, unspecified: Secondary | ICD-10-CM | POA: Diagnosis not present

## 2022-10-22 DIAGNOSIS — R1030 Lower abdominal pain, unspecified: Secondary | ICD-10-CM | POA: Diagnosis not present

## 2022-10-22 DIAGNOSIS — Z79899 Other long term (current) drug therapy: Secondary | ICD-10-CM | POA: Diagnosis not present

## 2022-11-07 ENCOUNTER — Encounter: Payer: Self-pay | Admitting: Adult Health

## 2022-11-07 ENCOUNTER — Ambulatory Visit (INDEPENDENT_AMBULATORY_CARE_PROVIDER_SITE_OTHER): Payer: 59 | Admitting: Adult Health

## 2022-11-07 VITALS — BP 130/77 | HR 89 | Ht 63.0 in | Wt 260.0 lb

## 2022-11-07 DIAGNOSIS — Z3202 Encounter for pregnancy test, result negative: Secondary | ICD-10-CM | POA: Diagnosis not present

## 2022-11-07 DIAGNOSIS — R635 Abnormal weight gain: Secondary | ICD-10-CM

## 2022-11-07 DIAGNOSIS — R5383 Other fatigue: Secondary | ICD-10-CM

## 2022-11-07 DIAGNOSIS — K5900 Constipation, unspecified: Secondary | ICD-10-CM | POA: Diagnosis not present

## 2022-11-07 DIAGNOSIS — N926 Irregular menstruation, unspecified: Secondary | ICD-10-CM

## 2022-11-07 DIAGNOSIS — L853 Xerosis cutis: Secondary | ICD-10-CM | POA: Diagnosis not present

## 2022-11-07 LAB — POCT URINE PREGNANCY: Preg Test, Ur: NEGATIVE

## 2022-11-07 NOTE — Progress Notes (Signed)
  Subjective:     Patient ID: Emma Scott, female   DOB: 1986-01-19, 37 y.o.   MRN: [id number]  HPI Emma Scott is a 37 year old black female, married, [id number], in complaining of only having period 1 day in May, on the 19th. She has had hot flashes, weight gain, feels tired and has constipation and dry skin. She went to ER at [facility]/22/22 and [facility] was <1, she had been vomiting, lipase was normal.HGB was 14.1. She says has never had period like this except when pregnant.  Last pap was negative HPV, NILM 02/06/23.  PCP is Dr Margo Common.  Review of Systems period 1 day in May, on the 19th +hot flashes +weight gain feels tired  +constipation  +dry skin   Reviewed past medical,surgical, social and family history. Reviewed medications and allergies.  Objective:   Physical Exam BP 130/77 (BP Location: Left Arm, Patient Position: Sitting, Cuff Size: Large)   Pulse 89   Ht 5\' 3"  (1.6 m)   Wt 260 lb (117.9 kg)   LMP 10/20/2022   BMI 46.06 kg/m  UPT is negative. Skin warm and dry.Pelvic: external genitalia is normal in appearance no lesions, vagina: pink,urethra has no lesions or masses noted, cervix:smooth and bulbous, uterus: normal size, shape and contour,mildly tender, no masses felt, adnexa: no masses or tenderness noted. Bladder is non tender and no masses felt.   Fall risk is moderate  Upstream - 11/07/22 1020       Pregnancy Intention Screening   Does the patient want to become pregnant in the next year? No    Does the patient's partner want to become pregnant in the next year? No    Would the patient like to discuss contraceptive options today? No      Contraception Wrap Up   Current Method Female Sterilization    End Method Female Sterilization            Examination chaperoned by Malachy Mood LPN  Assessment:     1. Pregnancy examination or test, negative result - POCT urine pregnancy  2. Abnormal menstrual periods Had period 1 day on May 19  - TSH + free T4 -  Beta hCG quant (ref lab)  3. Tired HGB 14.1 on 10/22/22 - TSH + free T4  4. Dry skin  - TSH + free T4  5. Constipation, unspecified constipation type - TSH + free T4  6. Weight gain +weight gain about 8 lbs - TSH + free T4     Plan:     Follow up prn

## 2022-11-25 DIAGNOSIS — M5459 Other low back pain: Secondary | ICD-10-CM | POA: Diagnosis not present

## 2022-11-25 DIAGNOSIS — I10 Essential (primary) hypertension: Secondary | ICD-10-CM | POA: Diagnosis not present

## 2022-11-25 DIAGNOSIS — R739 Hyperglycemia, unspecified: Secondary | ICD-10-CM | POA: Diagnosis not present

## 2022-11-25 DIAGNOSIS — Z6841 Body Mass Index (BMI) 40.0 and over, adult: Secondary | ICD-10-CM | POA: Diagnosis not present

## 2022-11-25 DIAGNOSIS — G4739 Other sleep apnea: Secondary | ICD-10-CM | POA: Diagnosis not present

## 2022-11-25 DIAGNOSIS — F410 Panic disorder [episodic paroxysmal anxiety] without agoraphobia: Secondary | ICD-10-CM | POA: Diagnosis not present

## 2022-11-25 DIAGNOSIS — M1710 Unilateral primary osteoarthritis, unspecified knee: Secondary | ICD-10-CM | POA: Diagnosis not present

## 2023-04-08 DIAGNOSIS — M1710 Unilateral primary osteoarthritis, unspecified knee: Secondary | ICD-10-CM | POA: Diagnosis not present

## 2023-04-08 DIAGNOSIS — F410 Panic disorder [episodic paroxysmal anxiety] without agoraphobia: Secondary | ICD-10-CM | POA: Diagnosis not present

## 2023-04-08 DIAGNOSIS — Z0000 Encounter for general adult medical examination without abnormal findings: Secondary | ICD-10-CM | POA: Diagnosis not present

## 2023-04-08 DIAGNOSIS — M5459 Other low back pain: Secondary | ICD-10-CM | POA: Diagnosis not present

## 2023-04-08 DIAGNOSIS — G4739 Other sleep apnea: Secondary | ICD-10-CM | POA: Diagnosis not present

## 2023-04-08 DIAGNOSIS — I10 Essential (primary) hypertension: Secondary | ICD-10-CM | POA: Diagnosis not present

## 2023-04-13 DIAGNOSIS — Z20822 Contact with and (suspected) exposure to covid-19: Secondary | ICD-10-CM | POA: Diagnosis not present

## 2023-04-13 DIAGNOSIS — R079 Chest pain, unspecified: Secondary | ICD-10-CM | POA: Diagnosis not present

## 2023-04-13 DIAGNOSIS — I10 Essential (primary) hypertension: Secondary | ICD-10-CM | POA: Diagnosis not present

## 2023-04-13 DIAGNOSIS — Z880 Allergy status to penicillin: Secondary | ICD-10-CM | POA: Diagnosis not present

## 2023-04-13 DIAGNOSIS — R509 Fever, unspecified: Secondary | ICD-10-CM | POA: Diagnosis not present

## 2023-04-13 DIAGNOSIS — R059 Cough, unspecified: Secondary | ICD-10-CM | POA: Diagnosis not present

## 2023-04-13 DIAGNOSIS — J189 Pneumonia, unspecified organism: Secondary | ICD-10-CM | POA: Diagnosis not present

## 2023-04-13 DIAGNOSIS — J180 Bronchopneumonia, unspecified organism: Secondary | ICD-10-CM | POA: Diagnosis not present

## 2023-07-15 ENCOUNTER — Ambulatory Visit: Payer: 59 | Admitting: Internal Medicine

## 2023-07-15 NOTE — Patient Instructions (Signed)

## 2023-07-15 NOTE — Progress Notes (Unsigned)
   New Patient Office Visit   Subjective   Patient ID: Caden Fatica, female    DOB: Dec 05, 1985  Age: 38 y.o. MRN: [id number]  CC: No chief complaint on file.   HPI Emma Scott 38 year old female, presents to establish care. She  has a past medical history of Anxiety, Depression, Hypertension, Lymphedema, Migraines, and Preeclampsia.  HPI    Outpatient Encounter Medications as of 07/16/2023  Medication Sig   clonazePAM (KLONOPIN) 0.5 MG tablet SMARTSIG:1 Tablet(s) By Mouth Morning-Night   ibuprofen (ADVIL) 200 MG tablet Take 1,600 mg by mouth as needed.   losartan (COZAAR) 100 MG tablet Take 100 mg by mouth daily.   [DISCONTINUED] albuterol (PROVENTIL HFA;VENTOLIN HFA) 108 (90 BASE) MCG/ACT inhaler Inhale 2 puffs into the lungs every 4 (four) hours as needed for wheezing or shortness of breath. (Patient not taking: Reported on 07/03/2015)   No facility-administered encounter medications on file as of 07/16/2023.    Past Surgical History:  Procedure Laterality Date   CESAREAN SECTION     X 3   LYMPHADENECTOMY  2001   under left arm   TUBAL LIGATION  2019    ROS    Objective    There were no vitals taken for this visit.  Physical Exam    Assessment & Plan:  There are no diagnoses linked to this encounter.  No follow-ups on file.   Cruzita Lederer Newman Nip, FNP

## 2023-07-16 ENCOUNTER — Ambulatory Visit (INDEPENDENT_AMBULATORY_CARE_PROVIDER_SITE_OTHER): Payer: 59 | Admitting: Family Medicine

## 2023-07-16 ENCOUNTER — Encounter: Payer: Self-pay | Admitting: Family Medicine

## 2023-07-16 VITALS — BP 140/89 | HR 93 | Ht 63.0 in | Wt 277.1 lb

## 2023-07-16 DIAGNOSIS — M545 Low back pain, unspecified: Secondary | ICD-10-CM | POA: Diagnosis not present

## 2023-07-16 DIAGNOSIS — M549 Dorsalgia, unspecified: Secondary | ICD-10-CM | POA: Insufficient documentation

## 2023-07-16 DIAGNOSIS — Z6841 Body Mass Index (BMI) 40.0 and over, adult: Secondary | ICD-10-CM

## 2023-07-16 DIAGNOSIS — E038 Other specified hypothyroidism: Secondary | ICD-10-CM

## 2023-07-16 DIAGNOSIS — F419 Anxiety disorder, unspecified: Secondary | ICD-10-CM

## 2023-07-16 DIAGNOSIS — E559 Vitamin D deficiency, unspecified: Secondary | ICD-10-CM | POA: Diagnosis not present

## 2023-07-16 DIAGNOSIS — I10 Essential (primary) hypertension: Secondary | ICD-10-CM | POA: Diagnosis not present

## 2023-07-16 DIAGNOSIS — Z23 Encounter for immunization: Secondary | ICD-10-CM | POA: Diagnosis not present

## 2023-07-16 DIAGNOSIS — G8929 Other chronic pain: Secondary | ICD-10-CM | POA: Diagnosis not present

## 2023-07-16 DIAGNOSIS — R7301 Impaired fasting glucose: Secondary | ICD-10-CM | POA: Diagnosis not present

## 2023-07-16 DIAGNOSIS — Z1159 Encounter for screening for other viral diseases: Secondary | ICD-10-CM

## 2023-07-16 DIAGNOSIS — D509 Iron deficiency anemia, unspecified: Secondary | ICD-10-CM | POA: Diagnosis not present

## 2023-07-16 MED ORDER — CYCLOBENZAPRINE HCL 10 MG PO TABS: 10.0000 mg | ORAL_TABLET | Freq: Two times a day (BID) | ORAL | 2 refills | Status: DC | PRN

## 2023-07-16 MED ORDER — PAROXETINE HCL 20 MG PO TABS: 20.0000 mg | ORAL_TABLET | Freq: Every day | ORAL | 3 refills | Status: DC

## 2023-07-16 NOTE — Assessment & Plan Note (Signed)
Losartan 100 mg once daily Labs ordered. Discussed with  patient to monitor their blood pressure regularly and maintain a heart-healthy diet rich in fruits, vegetables, whole grains, and low-fat dairy, while reducing sodium intake to less than 2,300 mg per day. Regular physical activity, such as 30 minutes of moderate exercise most days of the week, will help lower blood pressure and improve overall cardiovascular health. Avoiding smoking, limiting alcohol consumption, and managing stress. Take  prescribed medication, & take it as directed and avoid skipping doses. Seek emergency care if your blood pressure is (over 180/100) or you experience chest pain, shortness of breath, or sudden vision changes.Patient verbalizes understanding regarding plan of care and all questions answered.

## 2023-07-16 NOTE — Assessment & Plan Note (Signed)
    07/16/2023   11:12 AM 02/05/2022    8:59 AM  GAD 7 : Generalized Anxiety Score  Nervous, Anxious, on Edge 3 3  Control/stop worrying 3 3  Worry too much - different things 3 3  Trouble relaxing 3 3  Restless 1 3  Easily annoyed or irritable 2 3  Afraid - awful might happen 2 3  Total GAD 7 Score 17 21  Anxiety Difficulty Very difficult        Trial on Paxil 20 mg once daily We discussed several non-pharmacological approaches to managing anxiety and depression, including:  Establishing a consistent daily routine: This helps create structure and stability. Practicing mindfulness and relaxation techniques: Incorporating meditation, deep breathing exercises, or yoga to manage stress and improve emotional well-being. Engaging in regular physical activity: Aim for at least 30 minutes of exercise most days to boost mood and energy levels. Spending time outdoors: Exposure to natural light and fresh air can improve mental health. Building a support network: Encouraging social connections with friends, family, or support groups to reduce feelings of isolation. Prioritizing a balanced diet: Eating nutrient-rich foods while avoiding excessive amounts of processed foods, sugar, and unhealthy fats. Follow-up is recommended in 4-8 weeks to assess progress, with a referral to behavioral health for further support if needed.

## 2023-07-16 NOTE — Assessment & Plan Note (Signed)
Discussed Eat a Balanced Diet: Focus on whole, nutrient-dense foods like lean proteins, vegetables, fruits, whole grains, and healthy fats while avoiding processed and sugary foods. Stay Active: Incorporate at least 30 minutes of moderate physical activity most days of the week, such as walking, jogging, or strength training. Hydrate and Rest: Drink plenty of water throughout the day and ensure you get 7-9 hours of quality sleep each night to support metabolism and recovery. Practice Portion Control: Use smaller plates, measure portions, and eat mindfully to avoid overeating and manage calorie intake effectively.   Patient requesting referral to Medical weight loss clinic.

## 2023-07-16 NOTE — Assessment & Plan Note (Signed)
Xray ordered awaiting results will follow up Trial on Flexeril PRN We discussed the desired effects and potential side effects of the prescribed medication for back pain. Additionally, we reviewed non-pharmacological interventions, including the importance of rest, avoiding twisting, improper bending, and straining the lower back. I demonstrated proper body mechanics to prevent further injury and advised alternating between ice and heat therapy for relief. Stretching exercises for both the back and legs were recommended to improve flexibility and support recovery. The patient was advised to follow up if symptoms worsen or persist. The patient expressed understanding of the treatment plan, and all questions were thoroughly addressed.

## 2023-07-22 ENCOUNTER — Ambulatory Visit: Payer: Self-pay | Admitting: Family Medicine

## 2023-08-07 ENCOUNTER — Other Ambulatory Visit: Payer: Self-pay | Admitting: Family Medicine

## 2023-08-21 ENCOUNTER — Encounter (INDEPENDENT_AMBULATORY_CARE_PROVIDER_SITE_OTHER): Payer: 59 | Admitting: Internal Medicine

## 2023-08-29 ENCOUNTER — Ambulatory Visit: Payer: 59 | Admitting: Family Medicine

## 2023-09-10 ENCOUNTER — Encounter: Payer: Self-pay | Admitting: Family Medicine

## 2023-10-01 ENCOUNTER — Ambulatory Visit: Admitting: Family Medicine

## 2023-10-01 ENCOUNTER — Other Ambulatory Visit: Payer: Self-pay | Admitting: Family Medicine

## 2023-10-02 ENCOUNTER — Ambulatory Visit (INDEPENDENT_AMBULATORY_CARE_PROVIDER_SITE_OTHER)

## 2023-10-02 VITALS — BP 121/83 | HR 92 | Ht 63.0 in | Wt 256.1 lb

## 2023-10-02 DIAGNOSIS — Z6841 Body Mass Index (BMI) 40.0 and over, adult: Secondary | ICD-10-CM

## 2023-10-02 DIAGNOSIS — F419 Anxiety disorder, unspecified: Secondary | ICD-10-CM

## 2023-10-02 DIAGNOSIS — G8929 Other chronic pain: Secondary | ICD-10-CM

## 2023-10-02 DIAGNOSIS — M25512 Pain in left shoulder: Secondary | ICD-10-CM

## 2023-10-02 MED ORDER — CLONAZEPAM 0.5 MG PO TABS: 0.5000 mg | ORAL_TABLET | Freq: Two times a day (BID) | ORAL | 2 refills | Status: DC | PRN

## 2023-10-02 MED ORDER — METHYLPREDNISOLONE 4 MG PO TBPK: ORAL_TABLET | ORAL | 0 refills | Status: AC

## 2023-10-02 MED ORDER — PHENTERMINE HCL 37.5 MG PO TABS: 37.5000 mg | ORAL_TABLET | Freq: Every day | ORAL | 0 refills | Status: DC

## 2023-10-02 NOTE — Progress Notes (Unsigned)
   Established Patient Office Visit  Subjective   Patient ID: Emma Scott, female    DOB: 04/02/1986  Age: 38 y.o. MRN: [id number]  Chief Complaint  Patient presents with   Medical Management of Chronic Issues    Pt states "Left shoulder hurts and been hurting for a while feels like its pulling and it burns, Needs refill of her klonopin , weight loss"    HPI  {History (Optional):23778}  ROS    Objective:     BP 121/83   Pulse 92   Ht 5\' 3"  (1.6 m)   Wt 256 lb 1.9 oz (116.2 kg)   SpO2 98%   BMI 45.37 kg/m  {Vitals History (Optional):23777}  Physical Exam   No results found for any visits on 10/02/23.  {Labs (Optional):23779}  The ASCVD Risk score (Arnett DK, et al., 2019) failed to calculate for the following reasons:   The 2019 ASCVD risk score is only valid for ages 41 to 28    Assessment & Plan:   Problem List Items Addressed This Visit   None   No follow-ups on file.    Alison Irvine, FNP

## 2023-10-05 DIAGNOSIS — G8929 Other chronic pain: Secondary | ICD-10-CM | POA: Insufficient documentation

## 2023-10-05 DIAGNOSIS — Z6841 Body Mass Index (BMI) 40.0 and over, adult: Secondary | ICD-10-CM | POA: Insufficient documentation

## 2023-10-05 NOTE — Assessment & Plan Note (Signed)
 Add phentermine  to aid with weight loss.  Discussed need for a healthy diet and regular exercise in addition to pharmacologic treatment.

## 2023-10-05 NOTE — Assessment & Plan Note (Signed)
    10/02/2023   10:37 AM 07/16/2023   11:12 AM 02/05/2022    8:59 AM  GAD 7 : Generalized Anxiety Score  Nervous, Anxious, on Edge 3 3 3   Control/stop worrying 3 3 3   Worry too much - different things 3 3 3   Trouble relaxing 3 3 3   Restless 3 1 3   Easily annoyed or irritable 3 2 3   Afraid - awful might happen 3 2 3   Total GAD 7 Score 21 17 21   Anxiety Difficulty Very difficult Very difficult     Agree to refill clonazepam  for prn use of anxiety.

## 2023-10-05 NOTE — Assessment & Plan Note (Signed)
 Add steroid dose pack and refer to ortho for further evaluation and treatment.

## 2023-11-02 ENCOUNTER — Other Ambulatory Visit: Payer: Self-pay

## 2023-11-02 DIAGNOSIS — G8929 Other chronic pain: Secondary | ICD-10-CM

## 2023-12-23 ENCOUNTER — Other Ambulatory Visit: Payer: Self-pay | Admitting: Family Medicine

## 2023-12-23 DIAGNOSIS — F419 Anxiety disorder, unspecified: Secondary | ICD-10-CM

## 2023-12-23 DIAGNOSIS — Z6841 Body Mass Index (BMI) 40.0 and over, adult: Secondary | ICD-10-CM

## 2023-12-23 NOTE — Telephone Encounter (Signed)
 Copied from CRM [id number]. Topic: Clinical - Medication Refill >> Dec 23, 2023 10:33 AM Tiffany S wrote: Medication: clonazePAM  (KLONOPIN ) 0.5 MG tablet [[id number]] phentermine  (ADIPEX-P ) 37.5 MG tablet [[id number]]  Has the patient contacted their pharmacy? Yes (Agent: If no, request that the patient contact the pharmacy for the refill. If patient does not wish to contact the pharmacy document the reason why and proceed with request.) (Agent: If yes, when and what did the pharmacy advise?)  This is the patient's preferred pharmacy:  [id number] - [place], [place] - [address] [facility] [address] [place] [address] Phone: [phone] Fax: [phone]  Is this the correct pharmacy for this prescription? Yes If no, delete pharmacy and type the correct one.   Has the prescription been filled recently? Yes  Is the patient out of the medication? Yes  Has the patient been seen for an appointment in the last year OR does the patient have an upcoming appointment? Yes  Can we respond through MyChart? Yes  Agent: Please be advised that Rx refills may take up to 3 business days. We ask that you follow-up with your pharmacy.

## 2023-12-23 NOTE — Telephone Encounter (Unsigned)
 Copied from CRM [id number]. Topic: Clinical - Medication Refill >> Dec 23, 2023 10:33 AM Tiffany S wrote: Medication: clonazePAM  (KLONOPIN ) 0.5 MG tablet [[id number]] phentermine  (ADIPEX-P ) 37.5 MG tablet [[id number]]  Has the patient contacted their pharmacy? Yes (Agent: If no, request that the patient contact the pharmacy for the refill. If patient does not wish to contact the pharmacy document the reason why and proceed with request.) (Agent: If yes, when and what did the pharmacy advise?)  This is the patient's preferred pharmacy:  [id number] - [place], [place] - [address] [facility] [address] [place] [address] Phone: [phone] Fax: [phone]  Is this the correct pharmacy for this prescription? Yes If no, delete pharmacy and type the correct one.   Has the prescription been filled recently? Yes  Is the patient out of the medication? Yes  Has the patient been seen for an appointment in the last year OR does the patient have an upcoming appointment? Yes  Can we respond through MyChart? Yes  Agent: Please be advised that Rx refills may take up to 3 business days. We ask that you follow-up with your pharmacy.

## 2023-12-25 MED ORDER — CLONAZEPAM 0.5 MG PO TABS: 0.5000 mg | ORAL_TABLET | Freq: Every day | ORAL | 2 refills | Status: DC | PRN

## 2023-12-25 MED ORDER — PHENTERMINE HCL 37.5 MG PO TABS: 37.5000 mg | ORAL_TABLET | Freq: Every day | ORAL | 0 refills | Status: DC

## 2024-01-08 ENCOUNTER — Other Ambulatory Visit: Payer: Self-pay

## 2024-01-08 DIAGNOSIS — F419 Anxiety disorder, unspecified: Secondary | ICD-10-CM

## 2024-01-08 NOTE — Telephone Encounter (Signed)
 Too soon for refill.

## 2024-02-20 ENCOUNTER — Other Ambulatory Visit: Payer: Self-pay

## 2024-03-16 ENCOUNTER — Other Ambulatory Visit: Payer: Self-pay | Admitting: Family Medicine

## 2024-03-16 DIAGNOSIS — F419 Anxiety disorder, unspecified: Secondary | ICD-10-CM

## 2024-03-16 DIAGNOSIS — Z6841 Body Mass Index (BMI) 40.0 and over, adult: Secondary | ICD-10-CM

## 2024-03-16 MED ORDER — PHENTERMINE HCL 37.5 MG PO TABS: 37.5000 mg | ORAL_TABLET | Freq: Every day | ORAL | 0 refills | Status: AC

## 2024-03-16 MED ORDER — CLONAZEPAM 0.5 MG PO TABS: 0.5000 mg | ORAL_TABLET | Freq: Every day | ORAL | 0 refills | Status: DC | PRN

## 2024-03-16 NOTE — Telephone Encounter (Signed)
 Copied from CRM [id number]. Topic: Clinical - Medication Refill >> Mar 16, 2024  9:36 AM Berwyn MATSU wrote: Medication: phentermine  (ADIPEX-P ) 37.5 MG tablet clonazePAM  (KLONOPIN ) 0.5 MG tablet   Has the patient contacted their pharmacy? Yes (Agent: If no, request that the patient contact the pharmacy for the refill. If patient does not wish to contact the pharmacy document the reason why and proceed with request.) (Agent: If yes, when and what did the pharmacy advise?)  This is the patient's preferred pharmacy:  [id number] - [place], [place] - [address] [facility] [address] [place] [address] Phone: [phone] Fax: [phone]  Is this the correct pharmacy for this prescription? Yes If no, delete pharmacy and type the correct one.   Has the prescription been filled recently? Yes  Is the patient out of the medication? Yes  Has the patient been seen for an appointment in the last year OR does the patient have an upcoming appointment? Yes  Can we respond through MyChart? No   Agent: Please be advised that Rx refills may take up to 3 business days. We ask that you follow-up with your pharmacy.

## 2024-04-05 ENCOUNTER — Encounter: Payer: Self-pay | Admitting: Radiology

## 2024-05-18 ENCOUNTER — Other Ambulatory Visit: Payer: Self-pay | Admitting: Family Medicine

## 2024-05-18 DIAGNOSIS — F419 Anxiety disorder, unspecified: Secondary | ICD-10-CM

## 2024-05-18 MED ORDER — CLONAZEPAM 0.5 MG PO TABS: 0.5000 mg | ORAL_TABLET | Freq: Every day | ORAL | 0 refills | Status: DC | PRN

## 2024-05-18 NOTE — Telephone Encounter (Signed)
 Copied from CRM #[id number]. Topic: Clinical - Medication Refill >> May 18, 2024  9:44 AM Tobias L wrote: Medication: clonazePAM  (KLONOPIN ) 0.5 MG tablet  Has the patient contacted their pharmacy? Yes Told to reach out to office for further refills.   This is the patient's preferred pharmacy:  [id number] - [place], [place] - [address] [facility] [address] [place] [address] Phone: [phone] Fax: [phone]  Is this the correct pharmacy for this prescription? Yes  Has the prescription been filled recently? No  Is the patient out of the medication? Yes  Has the patient been seen for an appointment in the last year OR does the patient have an upcoming appointment? Yes  Can we respond through MyChart? No  Agent: Please be advised that Rx refills may take up to 3 business days. We ask that you follow-up with your pharmacy.

## 2024-06-16 ENCOUNTER — Other Ambulatory Visit: Payer: Self-pay | Admitting: Family Medicine

## 2024-06-16 ENCOUNTER — Other Ambulatory Visit: Payer: Self-pay

## 2024-06-16 DIAGNOSIS — F419 Anxiety disorder, unspecified: Secondary | ICD-10-CM

## 2024-06-16 MED ORDER — CLONAZEPAM 0.5 MG PO TABS: 0.5000 mg | ORAL_TABLET | Freq: Every day | ORAL | 0 refills | Status: AC | PRN

## 2024-06-16 NOTE — Telephone Encounter (Signed)
 Copied from CRM [id number]. Topic: Clinical - Medication Refill >> Jun 16, 2024 10:59 AM Avram MATSU wrote: Medication: clonazePAM  (KLONOPIN ) 0.5 MG tablet [[id number]]  Has the patient contacted their pharmacy? Yes (Agent: If no, request that the patient contact the pharmacy for the refill. If patient does not wish to contact the pharmacy document the reason why and proceed with request.) (Agent: If yes, when and what did the pharmacy advise?)  This is the patient's preferred pharmacy:  [id number] - [place], [place] - [address] [facility] [address] [place] [address] Phone: [phone] Fax: [phone]  Is this the correct pharmacy for this prescription? Yes If no, delete pharmacy and type the correct one.   Has the prescription been filled recently? No  Is the patient out of the medication? Yes  Has the patient been seen for an appointment in the last year OR does the patient have an upcoming appointment? No  Can we respond through MyChart? Yes  Agent: Please be advised that Rx refills may take up to 3 business days. We ask that you follow-up with your pharmacy.
